# Patient Record
Sex: Female | Born: 1985 | Race: Black or African American | Hispanic: No | Marital: Single | Smoking: Current every day smoker
Health system: Southern US, Community
[De-identification: ages and names within clinical notes are randomized; demographics above are authoritative.]

## PROBLEM LIST (undated history)

## (undated) ENCOUNTER — Inpatient Hospital Stay (HOSPITAL_COMMUNITY): Payer: Self-pay

## (undated) DIAGNOSIS — F329 Major depressive disorder, single episode, unspecified: Secondary | ICD-10-CM

## (undated) DIAGNOSIS — A599 Trichomoniasis, unspecified: Secondary | ICD-10-CM

## (undated) DIAGNOSIS — F319 Bipolar disorder, unspecified: Secondary | ICD-10-CM

## (undated) DIAGNOSIS — Z87440 Personal history of urinary (tract) infections: Secondary | ICD-10-CM

## (undated) DIAGNOSIS — R87619 Unspecified abnormal cytological findings in specimens from cervix uteri: Secondary | ICD-10-CM

## (undated) DIAGNOSIS — Z8619 Personal history of other infectious and parasitic diseases: Secondary | ICD-10-CM

## (undated) DIAGNOSIS — F32A Depression, unspecified: Secondary | ICD-10-CM

## (undated) DIAGNOSIS — IMO0002 Reserved for concepts with insufficient information to code with codable children: Secondary | ICD-10-CM

## (undated) HISTORY — DX: Personal history of other infectious and parasitic diseases: Z86.19

## (undated) HISTORY — PX: ADENOIDECTOMY: SUR15

## (undated) HISTORY — DX: Personal history of urinary (tract) infections: Z87.440

---
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
## 2003-03-14 ENCOUNTER — Emergency Department (HOSPITAL_COMMUNITY): Admission: EM | Admit: 2003-03-14 | Discharge: 2003-03-14 | Payer: Self-pay | Admitting: *Deleted

## 2003-09-05 ENCOUNTER — Emergency Department (HOSPITAL_COMMUNITY): Admission: EM | Admit: 2003-09-05 | Discharge: 2003-09-05 | Payer: Self-pay | Admitting: Emergency Medicine

## 2004-04-08 ENCOUNTER — Inpatient Hospital Stay (HOSPITAL_COMMUNITY): Admission: AD | Admit: 2004-04-08 | Discharge: 2004-04-08 | Payer: Self-pay | Admitting: Obstetrics and Gynecology

## 2004-04-13 ENCOUNTER — Inpatient Hospital Stay (HOSPITAL_COMMUNITY): Admission: AD | Admit: 2004-04-13 | Discharge: 2004-04-13 | Payer: Self-pay | Admitting: Obstetrics & Gynecology

## 2004-04-18 ENCOUNTER — Ambulatory Visit (HOSPITAL_COMMUNITY): Admission: RE | Admit: 2004-04-18 | Discharge: 2004-04-18 | Payer: Self-pay | Admitting: *Deleted

## 2004-08-11 ENCOUNTER — Inpatient Hospital Stay (HOSPITAL_COMMUNITY): Admission: AD | Admit: 2004-08-11 | Discharge: 2004-08-11 | Payer: Self-pay | Admitting: Family Medicine

## 2004-08-20 ENCOUNTER — Inpatient Hospital Stay (HOSPITAL_COMMUNITY): Admission: AD | Admit: 2004-08-20 | Discharge: 2004-08-23 | Payer: Self-pay | Admitting: *Deleted

## 2004-08-20 ENCOUNTER — Ambulatory Visit: Payer: Self-pay | Admitting: *Deleted

## 2004-08-28 ENCOUNTER — Ambulatory Visit: Payer: Self-pay | Admitting: Family Medicine

## 2004-09-03 ENCOUNTER — Ambulatory Visit (HOSPITAL_COMMUNITY): Admission: RE | Admit: 2004-09-03 | Discharge: 2004-09-03 | Payer: Self-pay | Admitting: *Deleted

## 2004-09-11 ENCOUNTER — Ambulatory Visit: Payer: Self-pay | Admitting: Obstetrics & Gynecology

## 2004-09-20 ENCOUNTER — Ambulatory Visit (HOSPITAL_COMMUNITY): Admission: RE | Admit: 2004-09-20 | Discharge: 2004-09-20 | Payer: Self-pay | Admitting: *Deleted

## 2004-09-23 ENCOUNTER — Ambulatory Visit: Payer: Self-pay | Admitting: Obstetrics and Gynecology

## 2004-09-23 ENCOUNTER — Ambulatory Visit (HOSPITAL_COMMUNITY): Admission: RE | Admit: 2004-09-23 | Discharge: 2004-09-23 | Payer: Self-pay | Admitting: *Deleted

## 2004-09-25 ENCOUNTER — Ambulatory Visit: Payer: Self-pay | Admitting: *Deleted

## 2004-10-01 ENCOUNTER — Ambulatory Visit: Payer: Self-pay | Admitting: *Deleted

## 2004-10-01 ENCOUNTER — Inpatient Hospital Stay (HOSPITAL_COMMUNITY): Admission: RE | Admit: 2004-10-01 | Discharge: 2004-10-01 | Payer: Self-pay | Admitting: *Deleted

## 2004-10-14 ENCOUNTER — Inpatient Hospital Stay (HOSPITAL_COMMUNITY): Admission: AD | Admit: 2004-10-14 | Discharge: 2004-10-14 | Payer: Self-pay | Admitting: Obstetrics and Gynecology

## 2004-10-16 ENCOUNTER — Ambulatory Visit: Payer: Self-pay | Admitting: *Deleted

## 2004-10-23 ENCOUNTER — Ambulatory Visit: Payer: Self-pay | Admitting: Obstetrics & Gynecology

## 2004-10-30 ENCOUNTER — Ambulatory Visit (HOSPITAL_COMMUNITY): Admission: RE | Admit: 2004-10-30 | Discharge: 2004-10-30 | Payer: Self-pay | Admitting: Obstetrics and Gynecology

## 2004-10-30 ENCOUNTER — Ambulatory Visit: Payer: Self-pay | Admitting: *Deleted

## 2004-11-06 ENCOUNTER — Inpatient Hospital Stay (HOSPITAL_COMMUNITY): Admission: AD | Admit: 2004-11-06 | Discharge: 2004-11-06 | Payer: Self-pay | Admitting: Obstetrics and Gynecology

## 2004-11-14 ENCOUNTER — Ambulatory Visit: Payer: Self-pay | Admitting: Family Medicine

## 2004-11-25 ENCOUNTER — Ambulatory Visit (HOSPITAL_COMMUNITY): Admission: AD | Admit: 2004-11-25 | Discharge: 2004-11-25 | Payer: Self-pay | Admitting: *Deleted

## 2004-11-28 ENCOUNTER — Ambulatory Visit: Payer: Self-pay | Admitting: Family Medicine

## 2004-12-02 ENCOUNTER — Ambulatory Visit: Payer: Self-pay | Admitting: Obstetrics & Gynecology

## 2004-12-02 ENCOUNTER — Inpatient Hospital Stay (HOSPITAL_COMMUNITY): Admission: AD | Admit: 2004-12-02 | Discharge: 2004-12-05 | Payer: Self-pay | Admitting: Obstetrics and Gynecology

## 2005-07-13 IMAGING — US US OB LIMITED
1 series · 13 of 28 positions shown · non-contrast
Comparison: none

CLINICAL DATA: 31 week 2 day assigned gestational age.  Multiple fetal anomalies and decreased amniotic fluid volume.  Follow-up amniotic fluid volume, biophysical profile and fetal Dopplers.

[Series 1: us ob limited · 0.33mm/px · 13 of 36 slices shown]
[im 2/36]
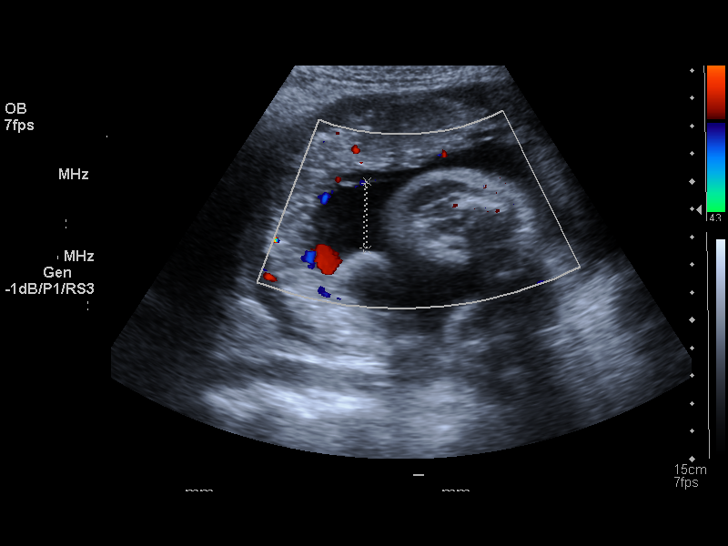
[im 4/36]
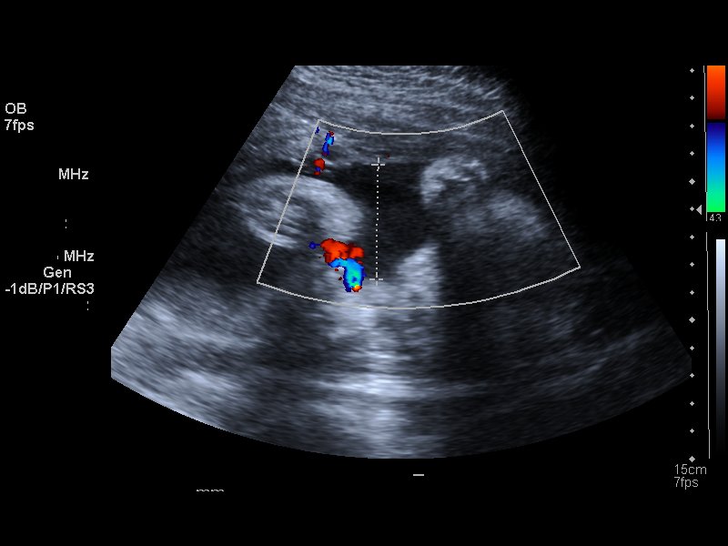
[im 7/36]
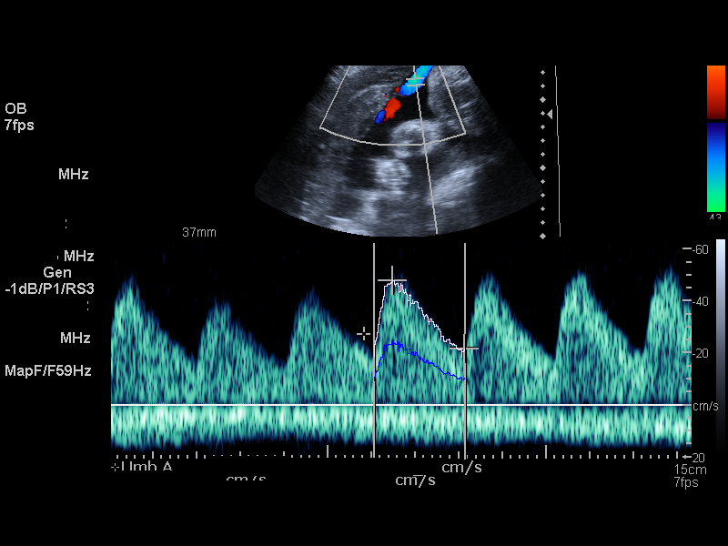
[im 10/36]
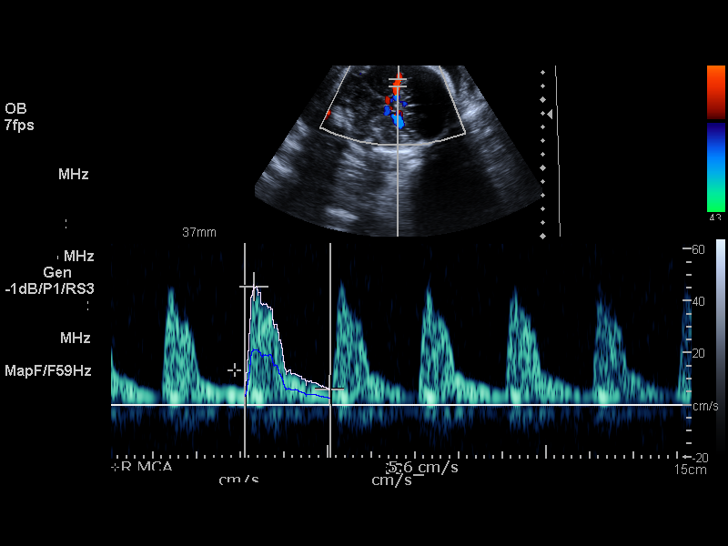
[im 12/36]
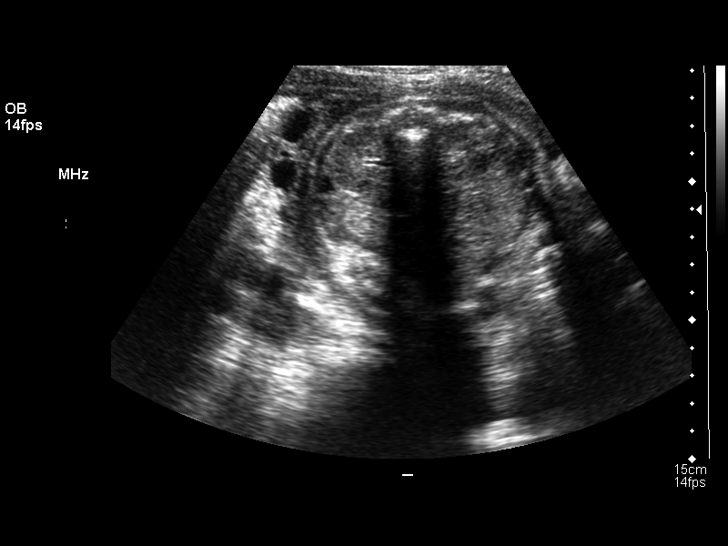
[im 15/36]
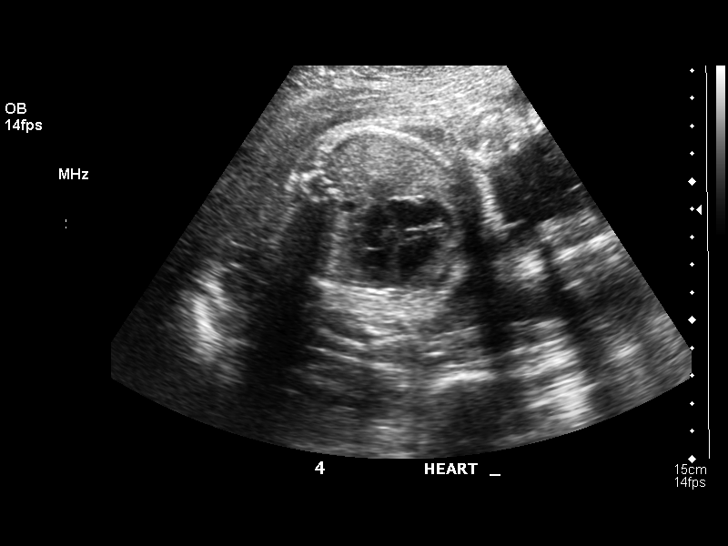
[im 19/36]
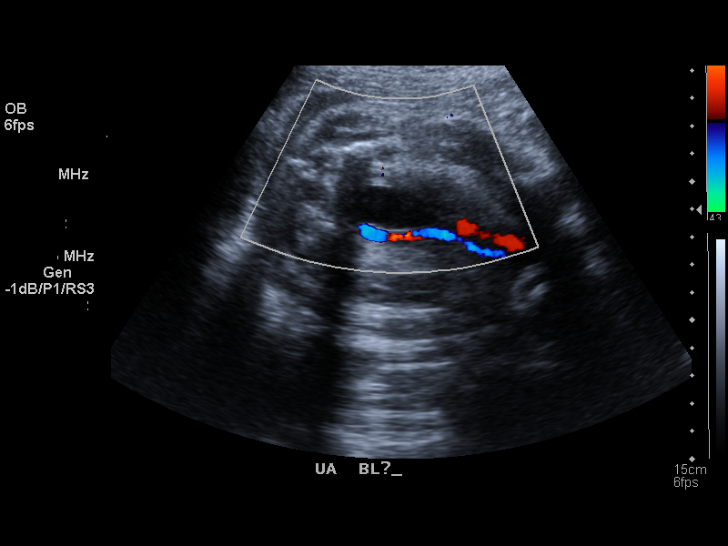
[im 21/36]
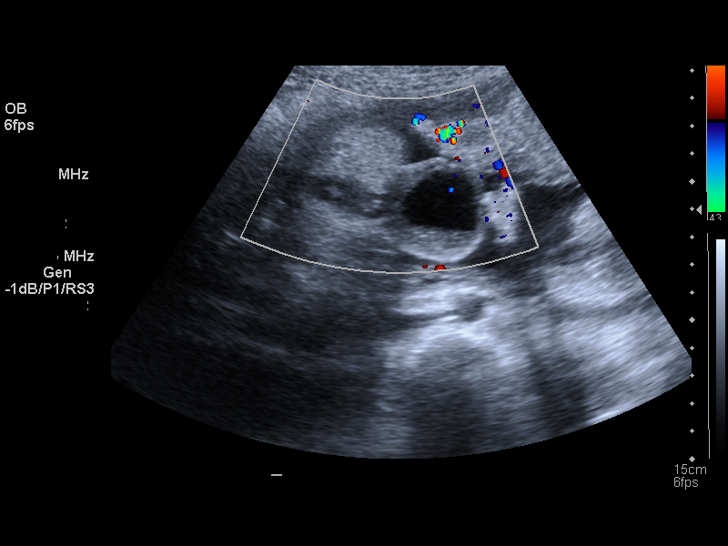
[im 24/36]
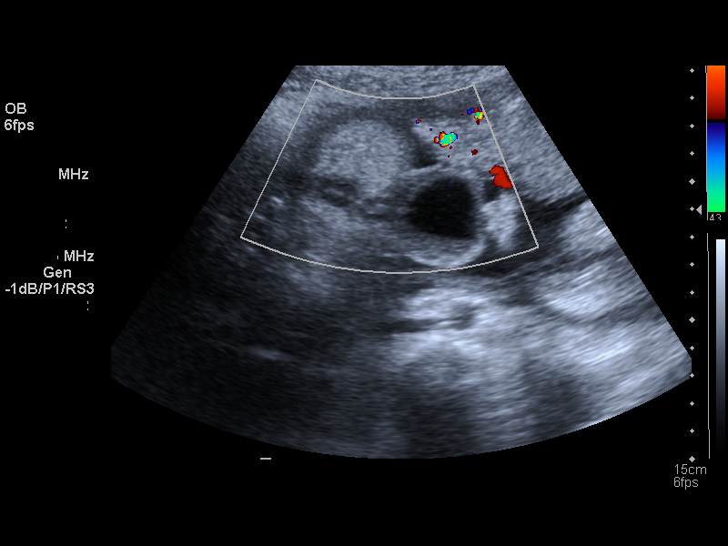
[im 26/36]
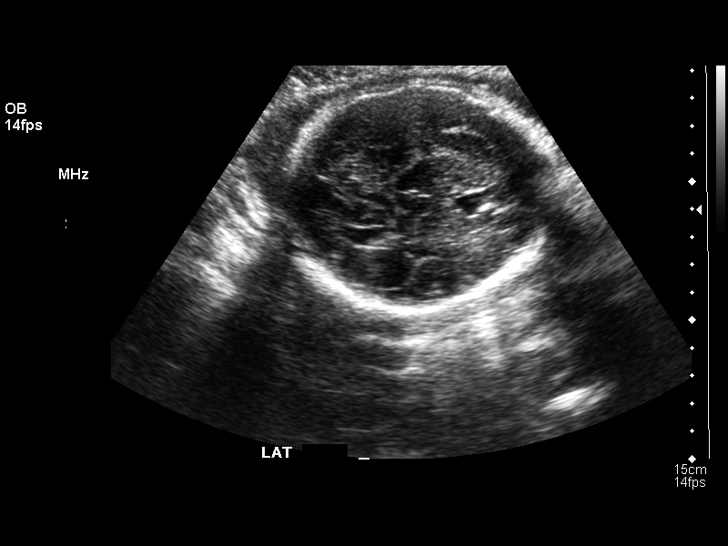
[im 29/36]
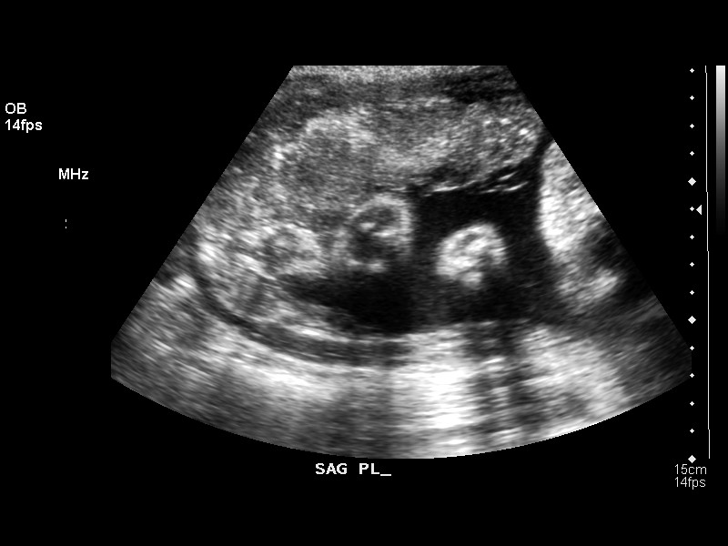
[im 32/36]
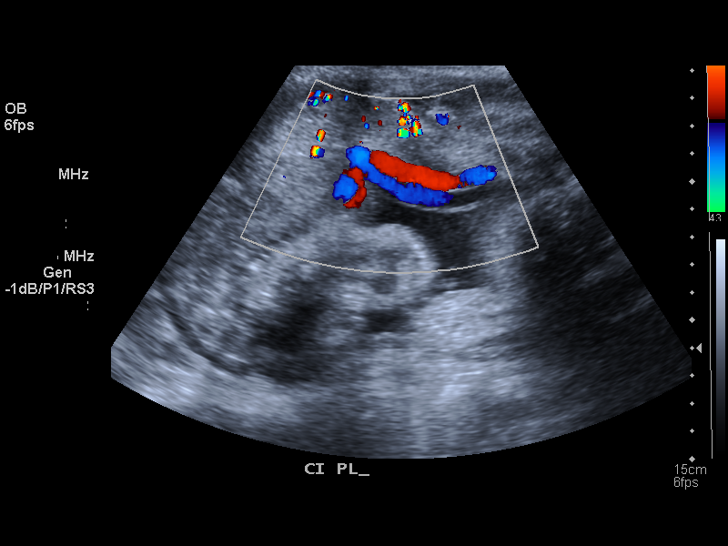
[im 34/36]
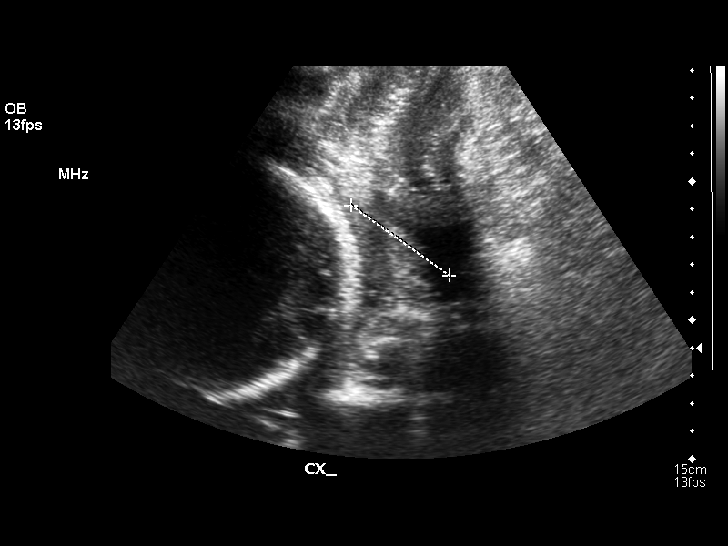

[13 of 28 positions shown; findings below may reference images not displayed]

LIMITED OBSTETRICAL ULTRASOUND:
 Number of Fetuses:  1
 Heart Rate:  150
 Movement:  Yes
 Breathing:  Yes
 Presentation:  Cephalic
 Placental Location:  Anterior
 Grade:  II
 Previa:  No
 Amniotic Fluid (Subjective):  Borderline decreased
 Amniotic Fluid (Objective):  9.2 cm AFI (5th -95th%ile = 8.8 ? 23.8 cm for 31 wks)

 Fetal measurements and complete anatomic evaluation were not requested.  The following fetal anatomy was visualized during this exam:  Lateral ventricles, four chamber heart on the right, stomach on the right, 2-vessel cord again noted and kidneys.
 Comment:  Again noted is situs inversus totalis, as well as 2-vessel cord and cystic structure arising from the genitalia measuring approximately 3.8 cm.  No evidence of fetal hydrops.  

 MATERNAL FINDINGS
 Cervix:  3.8 cm Translabially
IMPRESSION: Single living intrauterine fetus in cephalic presentation.  Amniotic fluid volume remains mildly decreased with AFI of 9.2 cm.  This is not significantly changed since prior study.  
 Multiple fetal anomalies again noted, as discussed above.

 BIOPHYSICAL PROFILE

 Movement:  2    Time:  25 minutes
 Breathing:  2
 Tone:  2
 Amniotic Fluid:  2

 Total Score:  8
IMPRESSION: Biophysical profile score [DATE].
 DOPPLER ULTRASOUND OF FETUS:

 Umbilical Artery S/D Ratio:  2.50    (NL< 400)

 Middle Cerebral Artery PI: 1.93    (NL> 1.48)
IMPRESSION: Both umbilical artery and fetal middle cerebral artery Doppler indices are within normal limits.

## 2005-09-19 ENCOUNTER — Emergency Department (HOSPITAL_COMMUNITY): Admission: EM | Admit: 2005-09-19 | Discharge: 2005-09-19 | Payer: Self-pay | Admitting: Family Medicine

## 2005-09-26 ENCOUNTER — Emergency Department (HOSPITAL_COMMUNITY): Admission: EM | Admit: 2005-09-26 | Discharge: 2005-09-26 | Payer: Self-pay | Admitting: Emergency Medicine

## 2005-12-30 ENCOUNTER — Inpatient Hospital Stay (HOSPITAL_COMMUNITY): Admission: AD | Admit: 2005-12-30 | Discharge: 2005-12-30 | Payer: Self-pay | Admitting: *Deleted

## 2006-01-29 ENCOUNTER — Inpatient Hospital Stay (HOSPITAL_COMMUNITY): Admission: AD | Admit: 2006-01-29 | Discharge: 2006-01-30 | Payer: Self-pay | Admitting: Family Medicine

## 2006-01-29 ENCOUNTER — Ambulatory Visit: Payer: Self-pay | Admitting: Family Medicine

## 2006-03-25 ENCOUNTER — Inpatient Hospital Stay (HOSPITAL_COMMUNITY): Admission: AD | Admit: 2006-03-25 | Discharge: 2006-03-27 | Payer: Self-pay | Admitting: Obstetrics and Gynecology

## 2006-05-20 ENCOUNTER — Inpatient Hospital Stay (HOSPITAL_COMMUNITY): Admission: AD | Admit: 2006-05-20 | Discharge: 2006-05-20 | Payer: Self-pay | Admitting: Obstetrics & Gynecology

## 2006-05-21 ENCOUNTER — Inpatient Hospital Stay (HOSPITAL_COMMUNITY): Admission: AD | Admit: 2006-05-21 | Discharge: 2006-05-24 | Payer: Self-pay | Admitting: Obstetrics

## 2010-11-30 ENCOUNTER — Encounter: Payer: Self-pay | Admitting: *Deleted

## 2011-07-08 ENCOUNTER — Inpatient Hospital Stay (HOSPITAL_COMMUNITY)
Admission: AD | Admit: 2011-07-08 | Discharge: 2011-07-08 | Disposition: A | Discharge status: Home / Self Care | Payer: Medicaid Other | Source: Ambulatory Visit | Attending: Family Medicine | Admitting: Family Medicine

## 2011-07-08 DIAGNOSIS — Z3201 Encounter for pregnancy test, result positive: Secondary | ICD-10-CM | POA: Insufficient documentation

## 2011-07-08 LAB — POCT PREGNANCY, URINE: Preg Test, Ur: POSITIVE

## 2011-07-08 NOTE — ED Provider Notes (Signed)
History     Chief Complaint  Patient presents with  . Amenorrhea   HPI Kim Briggs 25 y.o. needs only verification of positive home pregnancy test.  No pain.  No bleeding.    OB History    No data available      No past medical history on file.  No past surgical history on file.  No family history on file.  History  Substance Use Topics  . Smoking status: Not on file  . Smokeless tobacco: Not on file  . Alcohol Use: Not on file    Allergies: No Known Allergies  No prescriptions prior to admission    Review of Systems  Gastrointestinal: Negative for abdominal pain.  Genitourinary:       No vaginal bleeding.   Physical Exam   Blood pressure 115/63, pulse 90, temperature 99 F (37.2 C), temperature source Oral, resp. rate 20, height 5' 4.25" (1.632 m), weight 198 lb (89.812 kg), last menstrual period 05/22/2011.  Physical Exam  Nursing note and vitals reviewed. Constitutional: She is oriented to person, place, and time. She appears well-developed and well-nourished.  HENT:  Head: Normocephalic.  Eyes: EOM are normal.  Neck: Neck supple.  Musculoskeletal: Normal range of motion.  Neurological: She is alert and oriented to person, place, and time.  Skin: Skin is warm and dry.  Psychiatric: She has a normal mood and affect.    MAU Course  Procedures  MDM Results for orders placed during the hospital encounter of 07/08/11 (from the past 24 hour(s))  POCT PREGNANCY, URINE     Status: Normal   Collection Time   07/08/11 11:27 PM      Component Value Range   Preg Test, Ur POSITIVE       Assessment and Plan  Positive pregnancy test  Plan: Your pregnancy test is positive.  No smoking, no drugs, no alcohol.  Take a prenatal vitamin one by mouth every day.  Eat small frequent snacks to avoid nausea.  Begin prenatal care as soon as possible.   BURLESON,TERRI 07/08/2011, 11:40 PM

## 2011-07-08 NOTE — Progress Notes (Signed)
Pt states, " I have missed my period, and had two positive HPT. I need a pregnancy letter so I can start care."

## 2011-07-10 NOTE — ED Provider Notes (Signed)
Chart reviewed and agree with management and plan.  

## 2011-07-20 ENCOUNTER — Inpatient Hospital Stay (HOSPITAL_COMMUNITY)
Admission: AD | Admit: 2011-07-20 | Discharge: 2011-07-20 | Disposition: A | Discharge status: Home / Self Care | Payer: Medicaid Other | Source: Ambulatory Visit | Attending: Obstetrics & Gynecology | Admitting: Obstetrics & Gynecology

## 2011-07-20 ENCOUNTER — Encounter (HOSPITAL_COMMUNITY): Payer: Self-pay | Admitting: *Deleted

## 2011-07-20 DIAGNOSIS — O210 Mild hyperemesis gravidarum: Secondary | ICD-10-CM | POA: Insufficient documentation

## 2011-07-20 LAB — URINALYSIS, ROUTINE W REFLEX MICROSCOPIC
Bilirubin Urine: NEGATIVE
Glucose, UA: NEGATIVE mg/dL
Hgb urine dipstick: NEGATIVE
Ketones, ur: 15 mg/dL — AB
Leukocytes, UA: NEGATIVE
Nitrite: NEGATIVE
Protein, ur: NEGATIVE mg/dL
Specific Gravity, Urine: 1.025 (ref 1.005–1.030)
Urobilinogen, UA: 1 mg/dL (ref 0.0–1.0)
pH: 7 (ref 5.0–8.0)

## 2011-07-20 MED ORDER — SODIUM CHLORIDE 0.9 % IV SOLN
8.0000 mg | Freq: Once | INTRAVENOUS | Status: AC
  Administered 2011-07-20: 8 mg via INTRAVENOUS
  Filled 2011-07-20: qty 4

## 2011-07-20 MED ORDER — M.V.I. ADULT IV INJ
10.0000 mL | Freq: Once | INTRAVENOUS | Status: AC
  Administered 2011-07-20: 10 mL via INTRAVENOUS
  Filled 2011-07-20: qty 10

## 2011-07-20 MED ORDER — PROMETHAZINE HCL 50 MG PO TABS: 25.0000 mg | ORAL_TABLET | Freq: Four times a day (QID) | ORAL | Status: AC | PRN

## 2011-07-20 MED ORDER — DEXTROSE IN LACTATED RINGERS 5 % IV SOLN
Freq: Once | INTRAVENOUS | Status: AC
  Administered 2011-07-20: 03:00:00 via INTRAVENOUS

## 2011-07-20 MED ORDER — FAMOTIDINE IN NACL 20-0.9 MG/50ML-% IV SOLN
20.0000 mg | Freq: Two times a day (BID) | INTRAVENOUS | Status: DC
  Administered 2011-07-20: 20 mg via INTRAVENOUS
  Filled 2011-07-20: qty 50

## 2011-07-20 NOTE — Progress Notes (Signed)
Written and verbal d/c instructions given and understanding voiced. 

## 2011-07-20 NOTE — Progress Notes (Signed)
[facility] CNM in to see pt. Unable to hear FHR by doppler. CNM brought in u/s and able to see cardiac activity by u/s. Pt reassured

## 2011-07-20 NOTE — ED Notes (Signed)
Pt up to BR

## 2011-07-20 NOTE — ED Provider Notes (Signed)
Chief Complaint:  Abdominal Pain and Emesis During Pregnancy   Kim Briggs is  25 y.o. [id number].  Patient's last menstrual period was 05/22/2011.Marland Kitchen  Her pregnancy status is positive.  She presents complaining of Abdominal Pain and Emesis During Pregnancy . Onset is described as insidious and has been present for  2 days. Nausea with occassional vomiting during pregnancy until yesterday when vomiting increased and unable to keep any liquids or solids down. Reports 4-5 episodes of vomiting since last night. Denies fever, chills, diarrhea, vag bleeding or discharge. States sharp intermittent abd pain.  Obstetrical/Gynecological History: OB History    Grav Para Term Preterm Abortions TAB SAB Ect Mult Living   3 2 2  0 0 0 0 0 0 2      Past Medical History: Past Medical History  Diagnosis Date  . Asthma     Past Surgical History: Past Surgical History  Procedure Date  . No past surgeries     Family History: No family history on file.  Social History: History  Substance Use Topics  . Smoking status: Current Everyday Smoker  . Smokeless tobacco: Not on file  . Alcohol Use: No    Allergies: No Known Allergies  No prescriptions prior to admission    Review of Systems - History obtained from the patient General ROS: negative Allergy and Immunology ROS: negative Endocrine ROS: positive for - breast changes Respiratory ROS: no cough, shortness of breath, or wheezing Cardiovascular ROS: no chest pain or dyspnea on exertion Gastrointestinal ROS: positive for - abdominal pain and nausea/vomiting Genito-Urinary ROS: no dysuria, trouble voiding, or hematuria, no vag bleeding or discharge  Physical Exam   Blood pressure 114/62, pulse 85, temperature 98.9 F (37.2 C), resp. rate 20, height 5\' 4"  (1.626 m), weight 87.635 kg (193 lb 3.2 oz), last menstrual period 05/22/2011.  General: General appearance - alert, well appearing, and in no distress and overweight Mental status -  alert, oriented to person, place, and time, normal mood, behavior, speech, dress, motor activity, and thought processes, affect appropriate to mood Mouth - dental hygiene poor and mucous membranes dry Abdomen - soft, nontender, nondistended, no masses or organomegaly Back exam - full range of motion, no tenderness, palpable spasm or pain on motion Focused Gynecological Exam: examination not indicated, IUP with cardiac activity  Labs: No results found for this or any previous visit (from the past 24 hour(s)). Imaging Studies:  No results found.   Assessment Hyperemesis  Plan: Discharge home Rx antiemetics  SHORES,SUZANNE E. 07/20/2011,2:31 AM   Attestation of Attending Supervision of Advanced Practitioner (CNM/NP): Evaluation and management procedures were performed by the Advanced Practitioner under my supervision and collaboration. I have reviewed the Advanced Practitioner's note and chart, and I agree with the management and plan.  Merrin Mcvicker H. 11:02 AM

## 2011-07-20 NOTE — ED Notes (Signed)
Pt has had couple episodes of heaves with small amt cl emesis.

## 2011-07-20 NOTE — Progress Notes (Signed)
Discussed with pt eating small frequent meals and eating bland foods until nausea improves

## 2011-07-20 NOTE — Progress Notes (Signed)
GeP2 at 12wks. Has had n/v the entire pregnancy but worse since yesterday. Unable to keep down anything. Sharp pain lower abd and on sides of abd. Denies diarrhea

## 2011-09-01 LAB — ANTIBODY SCREEN: Antibody Screen: NEGATIVE

## 2011-09-01 LAB — HEPATITIS B SURFACE ANTIGEN: Hepatitis B Surface Ag: NEGATIVE

## 2011-09-01 LAB — ABO/RH

## 2011-09-01 LAB — RPR: RPR: NONREACTIVE

## 2011-11-11 NOTE — L&D Delivery Note (Signed)
Delivery Note Called by pt's RN around 0110 with report of pt feeling pressure.  FHT stable in 130's, with some early decels; shortly after IUPC had been placed, FHT with a run of moderate variables, but these resolved w/ position change.  Pt complete and +2 with urge to push at 0118.  Pushed great to SVD at 1:32 AM.  A viable female "[name]" was delivered via Vaginal, Spontaneous Delivery (Presentation: Right Occiput Anterior).  APGAR: 8, 9; weight 7 lb 13 oz (3544 g).  Newborn placed immediately on mom's abdomen where dried and stimulated and skin-to-skin initiated.  Cord doubly clamped and cut by FOB.   Placenta status: Intact, Spontaneous, Schultz.  Cord: 3 vessels with the following complications: None.  Cord pH: n/a  Anesthesia: Epidural  Episiotomy: None Lacerations: Rt Periurethral (hemostatic; not repaired). Suture Repair: n/a Est. Blood Loss (mL): 200  Mom to postpartum.  Baby to nursery-stable. Pt intends to breastfeed.    [name] H 03/03/2012, 6:56 AM

## 2012-01-07 ENCOUNTER — Encounter: Payer: Medicaid Other | Admitting: Obstetrics and Gynecology

## 2012-01-14 ENCOUNTER — Encounter (INDEPENDENT_AMBULATORY_CARE_PROVIDER_SITE_OTHER): Payer: Medicaid Other | Admitting: Obstetrics and Gynecology

## 2012-01-14 DIAGNOSIS — Z331 Pregnant state, incidental: Secondary | ICD-10-CM

## 2012-01-27 ENCOUNTER — Encounter: Payer: Medicaid Other | Admitting: Obstetrics and Gynecology

## 2012-02-10 ENCOUNTER — Encounter: Payer: Medicaid Other | Admitting: Obstetrics and Gynecology

## 2012-02-12 ENCOUNTER — Encounter: Payer: Medicaid Other | Admitting: Obstetrics and Gynecology

## 2012-02-16 ENCOUNTER — Encounter (INDEPENDENT_AMBULATORY_CARE_PROVIDER_SITE_OTHER): Payer: Medicaid Other | Admitting: Registered Nurse

## 2012-02-16 ENCOUNTER — Other Ambulatory Visit: Payer: Self-pay | Admitting: Registered Nurse

## 2012-02-16 DIAGNOSIS — Z0000 Encounter for general adult medical examination without abnormal findings: Secondary | ICD-10-CM

## 2012-02-16 DIAGNOSIS — Z331 Pregnant state, incidental: Secondary | ICD-10-CM

## 2012-02-23 DIAGNOSIS — R87612 Low grade squamous intraepithelial lesion on cytologic smear of cervix (LGSIL): Secondary | ICD-10-CM

## 2012-02-23 DIAGNOSIS — F319 Bipolar disorder, unspecified: Secondary | ICD-10-CM | POA: Insufficient documentation

## 2012-02-23 DIAGNOSIS — F1990 Other psychoactive substance use, unspecified, uncomplicated: Secondary | ICD-10-CM

## 2012-02-23 DIAGNOSIS — IMO0002 Reserved for concepts with insufficient information to code with codable children: Secondary | ICD-10-CM | POA: Insufficient documentation

## 2012-02-23 DIAGNOSIS — F17200 Nicotine dependence, unspecified, uncomplicated: Secondary | ICD-10-CM | POA: Insufficient documentation

## 2012-02-24 ENCOUNTER — Encounter (HOSPITAL_COMMUNITY): Payer: Self-pay | Admitting: *Deleted

## 2012-02-24 ENCOUNTER — Inpatient Hospital Stay (HOSPITAL_COMMUNITY)
Admission: AD | Admit: 2012-02-24 | Discharge: 2012-02-24 | Disposition: A | Discharge status: Home / Self Care | Payer: Medicaid Other | Source: Ambulatory Visit | Attending: Obstetrics and Gynecology | Admitting: Obstetrics and Gynecology

## 2012-02-24 DIAGNOSIS — F1990 Other psychoactive substance use, unspecified, uncomplicated: Secondary | ICD-10-CM

## 2012-02-24 DIAGNOSIS — O471 False labor at or after 37 completed weeks of gestation: Secondary | ICD-10-CM

## 2012-02-24 DIAGNOSIS — O99340 Other mental disorders complicating pregnancy, unspecified trimester: Secondary | ICD-10-CM

## 2012-02-24 DIAGNOSIS — R87612 Low grade squamous intraepithelial lesion on cytologic smear of cervix (LGSIL): Secondary | ICD-10-CM

## 2012-02-24 DIAGNOSIS — O99330 Smoking (tobacco) complicating pregnancy, unspecified trimester: Secondary | ICD-10-CM | POA: Insufficient documentation

## 2012-02-24 DIAGNOSIS — F319 Bipolar disorder, unspecified: Secondary | ICD-10-CM

## 2012-02-24 DIAGNOSIS — F17200 Nicotine dependence, unspecified, uncomplicated: Secondary | ICD-10-CM

## 2012-02-24 DIAGNOSIS — IMO0002 Reserved for concepts with insufficient information to code with codable children: Secondary | ICD-10-CM

## 2012-02-24 DIAGNOSIS — O479 False labor, unspecified: Secondary | ICD-10-CM | POA: Insufficient documentation

## 2012-02-24 HISTORY — DX: Bipolar disorder, unspecified: F31.9

## 2012-02-24 HISTORY — DX: Depression, unspecified: F32.A

## 2012-02-24 HISTORY — DX: Major depressive disorder, single episode, unspecified: F32.9

## 2012-02-24 MED ORDER — DIPHENHYDRAMINE HCL 25 MG PO CAPS
50.0000 mg | ORAL_CAPSULE | Freq: Once | ORAL | Status: AC
  Administered 2012-02-24: 50 mg via ORAL
  Filled 2012-02-24: qty 2

## 2012-02-24 NOTE — Discharge Instructions (Signed)
Normal Labor and Delivery Your caregiver must first be sure you are in labor. Signs of labor include:  You may pass what is called "the mucus plug" before labor begins. This is a small amount of blood stained mucus.   Regular uterine contractions.   The time between contractions get closer together.   The discomfort and pain gradually gets more intense.   Pains are mostly located in the back.   Pains get worse when walking.   The cervix (the opening of the uterus becomes thinner (begins to efface) and opens up (dilates).  Once you are in labor and admitted into the hospital or care center, your caregiver will do the following:  A complete physical examination.   Check your vital signs (blood pressure, pulse, temperature and the fetal heart rate).   Do a vaginal examination (using a sterile glove and lubricant) to determine:   The position (presentation) of the baby (head [vertex] or buttock first).   The level (station) of the baby's head in the birth canal.   The effacement and dilatation of the cervix.   You may have your pubic hair shaved and be given an enema depending on your caregiver and the circumstance.   An electronic monitor is usually placed on your abdomen. The monitor follows the length and intensity of the contractions, as well as the baby's heart rate.   Usually, your caregiver will insert an IV in your arm with a bottle of sugar water. This is done as a precaution so that medications can be given to you quickly during labor or delivery.  NORMAL LABOR AND DELIVERY IS DIVIDED UP INTO 3 STAGES: First Stage This is when regular contractions begin and the cervix begins to efface and dilate. This stage can last from 3 to 15 hours. The end of the first stage is when the cervix is 100% effaced and 10 centimeters dilated. Pain medications may be given by   Injection (morphine, demerol, etc.)   Regional anesthesia (spinal, caudal or epidural, anesthetics given in  different locations of the spine). Paracervical pain medication may be given, which is an injection of and anesthetic on each side of the cervix.  A pregnant woman may request to have "Natural Childbirth" which is not to have any medications or anesthesia during her labor and delivery. Second Stage This is when the baby comes down through the birth canal (vagina) and is born. This can take 1 to 4 hours. As the baby's head comes down through the birth canal, you may feel like you are going to have a bowel movement. You will get the urge to bear down and push until the baby is delivered. As the baby's head is being delivered, the caregiver will decide if an episiotomy (a cut in the perineum and vagina area) is needed to prevent tearing of the tissue in this area. The episiotomy is sewn up after the delivery of the baby and placenta. Sometimes a mask with nitrous oxide is given for the mother to breath during the delivery of the baby to help if there is too much pain. The end of Stage 2 is when the baby is fully delivered. Then when the umbilical cord stops pulsating it is clamped and cut. Third Stage The third stage begins after the baby is completely delivered and ends after the placenta (afterbirth) is delivered. This usually takes 5 to 30 minutes. After the placenta is delivered, a medication is given either by intravenous or injection to help contract   the uterus and prevent bleeding. The third stage is not painful and pain medication is usually not necessary. If an episiotomy was done, it is repaired at this time. After the delivery, the mother is watched and monitored closely for 1 to 2 hours to make sure there is no postpartum bleeding (hemorrhage). If there is a lot of bleeding, medication is given to contract the uterus and stop the bleeding. Document Released: 08/05/2008 Document Revised: 10/16/2011 Document Reviewed: 08/05/2008 [employer] Patient Information 2012 [employer]. 

## 2012-02-24 NOTE — MAU Provider Note (Signed)
  History   Kim Briggs is a 25y.o. Black female who presents unannounced for labor check at 39.5 weeks w/ CC of ctxs since about 1730.  Reports pain w/ ctxs is primarily on her "Rt side" and feels like a "pinching" feeling.  Denies LOF or VB.  Reports cx in office last week ="Fingertip."  Denies any recent illness, fever, UTI, or PIH s/s.  Report "increased" fetal movement today.    Pregnancy r/f: 1.  abnl pap and colpo during pregnancy 2.  H/o incarceration about 2-3 years ago for drug charges 3. Smoker 4.  H/o polyhydramnios and oligohydramnios in last pregnancy 5.  Bipolar--no meds 6. H/o chlamydia in the past  CSN: [id number]  Arrival date and time: 02/24/12 2227   None     Chief Complaint  Patient presents with  . Abdominal Pain   HPI  OB History    Grav Para Term Preterm Abortions TAB SAB Ect Mult Living   2 1 1  0 0 0 0 0 0 1      Past Medical History  Diagnosis Date  . Asthma   . Depression   . Bipolar 1 disorder     Past Surgical History  Procedure Date  . No past surgeries     Family History  Problem Relation Age of Onset  . Hypertension Mother   . Diabetes Mother   . Heart disease Mother   . Hypertension Father     History  Substance Use Topics  . Smoking status: Current Everyday Smoker -- 0.5 packs/day  . Smokeless tobacco: Not on file  . Alcohol Use: No    Allergies: No Known Allergies  Prescriptions prior to admission  Medication Sig Dispense Refill  . flintstones complete (FLINTSTONES) 60 MG chewable tablet Chew 1 tablet by mouth daily.        ROS--see history above Physical Exam  EFM:  140, reactive, moderate variability, no decels TOCO:  UC's q 8-9 min Blood pressure 132/70, pulse 99, temperature 98.1 F (36.7 C), temperature source Oral, resp. rate 18, height 5' 5.5" (1.664 m), weight 101.152 kg (223 lb), last menstrual period 05/22/2011.  Physical Exam  Constitutional: She is oriented to person, place, and time. She appears  well-developed and well-nourished.       Grimace, but NAD, no labored breathing  Cardiovascular: Normal rate.   Respiratory: Effort normal.  GI: Soft.       gravid  Genitourinary:       Cx:  FT int os/ ext os 1cm/25/-2; post; medium thickness  Musculoskeletal: She exhibits edema.       Pedal and ankle edema and bilateral shins; 1+  Neurological: She is alert and oriented to person, place, and time.  Skin: Skin is warm and dry.    MAU Course  Procedures 1.  NST 2.  Benadryl 50mg  po x1 for therapeutic rest--given at time of d/c Assessment and Plan  1.  IUP at 39.5 2.  False vs early/prodromal labor 3.  Reactive NST 4.  Bipolar 5.  Smoker  1.  D/c home w/ labor precautions 2.  Keep appt tomorrow at [facility] 3.  F/u as needed  Jorgeluis Gurganus H 02/24/2012, 11:41 PM

## 2012-02-25 ENCOUNTER — Ambulatory Visit (INDEPENDENT_AMBULATORY_CARE_PROVIDER_SITE_OTHER): Payer: Medicaid Other | Admitting: Obstetrics and Gynecology

## 2012-02-25 VITALS — BP 110/68 | Wt 225.0 lb

## 2012-02-25 DIAGNOSIS — Z331 Pregnant state, incidental: Secondary | ICD-10-CM

## 2012-02-25 DIAGNOSIS — R87612 Low grade squamous intraepithelial lesion on cytologic smear of cervix (LGSIL): Secondary | ICD-10-CM

## 2012-02-25 DIAGNOSIS — IMO0002 Reserved for concepts with insufficient information to code with codable children: Secondary | ICD-10-CM

## 2012-02-25 NOTE — Progress Notes (Signed)
Reports Ct every 8 minutes. Seen last night at MAU cervix was 1+ Benadryl helped sleep Reviewed post-dates  NST at next visit

## 2012-02-29 ENCOUNTER — Inpatient Hospital Stay (HOSPITAL_COMMUNITY)
Admission: AD | Admit: 2012-02-29 | Discharge: 2012-02-29 | Disposition: A | Discharge status: Home / Self Care | Payer: Medicaid Other | Source: Ambulatory Visit | Attending: Obstetrics and Gynecology | Admitting: Obstetrics and Gynecology

## 2012-02-29 ENCOUNTER — Encounter (HOSPITAL_COMMUNITY): Payer: Self-pay | Admitting: *Deleted

## 2012-02-29 DIAGNOSIS — O4700 False labor before 37 completed weeks of gestation, unspecified trimester: Secondary | ICD-10-CM

## 2012-02-29 DIAGNOSIS — O26859 Spotting complicating pregnancy, unspecified trimester: Secondary | ICD-10-CM

## 2012-02-29 DIAGNOSIS — O479 False labor, unspecified: Secondary | ICD-10-CM | POA: Insufficient documentation

## 2012-02-29 DIAGNOSIS — O471 False labor at or after 37 completed weeks of gestation: Secondary | ICD-10-CM

## 2012-02-29 NOTE — MAU Note (Signed)
Notified CNM patient in room 3 in MAU c/o bloody discharge G2P1 [redacted]w[redacted]d occasional contraction, fhr reactive.

## 2012-02-29 NOTE — MAU Provider Note (Signed)
Chief Complaint:  Vaginal Bleeding  First Provider Initiated Contact with Patient 02/29/12 1150    HPI  Kim Briggs is  26 y.o. G2P1001 at [redacted]w[redacted]d presents with scant brown discharge last night and this morning. She reports mild irregular contractions and denies leakage of fluid. Good fetal movement.   Pregnancy Course:  Patient Active Problem List  Diagnoses  . Smoker  . Drug use  . Abnormal Pap smear, low grade squamous intraepithelial lesion (LGSIL)  . Bipolar 1 disorder   Past Medical History: Past Medical History  Diagnosis Date  . Asthma   . Depression   . Bipolar 1 disorder     Past Surgical History: Past Surgical History  Procedure Date  . No past surgeries     Family History: Family History  Problem Relation Age of Onset  . Hypertension Mother   . Diabetes Mother   . Heart disease Mother   . Hypertension Father     Social History: History  Substance Use Topics  . Smoking status: Current Everyday Smoker -- 0.5 packs/day  . Smokeless tobacco: Not on file  . Alcohol Use: No    Allergies: No Known Allergies  Meds:  Prescriptions prior to admission  Medication Sig Dispense Refill  . flintstones complete (FLINTSTONES) 60 MG chewable tablet Chew 1 tablet by mouth daily.             Physical Exam  Blood pressure 133/70, pulse 91, temperature 98.7 F (37.1 C), temperature source Oral, resp. rate 20, height 5' 5.5" (1.664 m), weight 102.059 kg (225 lb), last menstrual period 05/22/2011. GENERAL: Well-developed, well-nourished female in no acute distress.  ABDOMEN: Soft, nontender, nondistended, gravid.  EXTREMITIES: Nontender, no edema Dilation: 1 Effacement (%):  (Long) Cervical Position: Posterior Station:  (HIGH) unable to reach Presentation: Vertex, verified by informal BS [place]. Exam by:: vsmith,cnm: Scant brown bloody show  FHT:  Baseline 140 , initially minimal variability, now moderate variability, accelerations present, no  decelerations Contractions: q 9-10 mins, mild   Labs:   Imaging:    Assessment: 1. False labor after 37 weeks of gestation without delivery   2.  Normal bloody show   Plan: Medication List  As of 02/29/2012 12:31 PM   CONTINUE taking these medications         flintstones complete 60 MG chewable tablet           Follow-up Information    Follow up with [facility] on 03/04/2012. (or MAU as needed if symptoms worsen)    Contact information:   [address] [place] [address]        Labor precautions and FKCs Comfort measures  [place], [place] 4/21/201312:21 PM

## 2012-02-29 NOTE — MAU Note (Signed)
Pt reports waking up and seeing some brownish bloody discharge. Denies ctx. Reports good fetal movement

## 2012-03-01 ENCOUNTER — Telehealth (HOSPITAL_COMMUNITY): Payer: Self-pay | Admitting: *Deleted

## 2012-03-01 ENCOUNTER — Encounter (HOSPITAL_COMMUNITY): Payer: Self-pay | Admitting: *Deleted

## 2012-03-01 NOTE — Telephone Encounter (Signed)
Preadmission screen  

## 2012-03-02 ENCOUNTER — Encounter (HOSPITAL_COMMUNITY): Payer: Self-pay | Admitting: *Deleted

## 2012-03-02 ENCOUNTER — Telehealth (HOSPITAL_COMMUNITY): Payer: Self-pay | Admitting: *Deleted

## 2012-03-02 ENCOUNTER — Inpatient Hospital Stay (HOSPITAL_COMMUNITY): Payer: Medicaid Other | Admitting: Anesthesiology

## 2012-03-02 ENCOUNTER — Inpatient Hospital Stay (HOSPITAL_COMMUNITY)
Admission: AD | Admit: 2012-03-02 | Discharge: 2012-03-04 | DRG: 775 | Disposition: A | Discharge status: Home / Self Care | Payer: Medicaid Other | Source: Ambulatory Visit | Attending: Obstetrics and Gynecology | Admitting: Obstetrics and Gynecology

## 2012-03-02 ENCOUNTER — Encounter (HOSPITAL_COMMUNITY): Payer: Self-pay | Admitting: Anesthesiology

## 2012-03-02 DIAGNOSIS — O480 Post-term pregnancy: Secondary | ICD-10-CM | POA: Diagnosis present

## 2012-03-02 DIAGNOSIS — F17200 Nicotine dependence, unspecified, uncomplicated: Secondary | ICD-10-CM

## 2012-03-02 DIAGNOSIS — O4290 Premature rupture of membranes, unspecified as to length of time between rupture and onset of labor, unspecified weeks of gestation: Principal | ICD-10-CM | POA: Diagnosis present

## 2012-03-02 DIAGNOSIS — IMO0002 Reserved for concepts with insufficient information to code with codable children: Secondary | ICD-10-CM

## 2012-03-02 DIAGNOSIS — O4210 Premature rupture of membranes, onset of labor more than 24 hours following rupture, unspecified weeks of gestation: Secondary | ICD-10-CM | POA: Diagnosis present

## 2012-03-02 DIAGNOSIS — F319 Bipolar disorder, unspecified: Secondary | ICD-10-CM

## 2012-03-02 DIAGNOSIS — O42919 Preterm premature rupture of membranes, unspecified as to length of time between rupture and onset of labor, unspecified trimester: Secondary | ICD-10-CM

## 2012-03-02 DIAGNOSIS — F1990 Other psychoactive substance use, unspecified, uncomplicated: Secondary | ICD-10-CM

## 2012-03-02 DIAGNOSIS — E669 Obesity, unspecified: Secondary | ICD-10-CM

## 2012-03-02 LAB — CBC
Hemoglobin: 11.2 g/dL — ABNORMAL LOW (ref 12.0–15.0)
MCH: 29.7 pg (ref 26.0–34.0)
RBC: 3.77 MIL/uL — ABNORMAL LOW (ref 3.87–5.11)
WBC: 13.2 10*3/uL — ABNORMAL HIGH (ref 4.0–10.5)

## 2012-03-02 LAB — RAPID URINE DRUG SCREEN, HOSP PERFORMED
Opiates: NOT DETECTED
Tetrahydrocannabinol: NOT DETECTED

## 2012-03-02 LAB — RPR: RPR Ser Ql: NONREACTIVE

## 2012-03-02 LAB — AMNISURE RUPTURE OF MEMBRANE (ROM) NOT AT ARMC: Amnisure ROM: POSITIVE

## 2012-03-02 MED ORDER — OXYTOCIN 20 UNITS IN LACTATED RINGERS INFUSION - SIMPLE: 125.0000 mL/h | Freq: Once | INTRAVENOUS | Status: DC

## 2012-03-02 MED ORDER — LIDOCAINE HCL (PF) 1 % IJ SOLN
INTRAMUSCULAR | Status: DC | PRN
  Administered 2012-03-02 (×3): 4 mL

## 2012-03-02 MED ORDER — LIDOCAINE HCL (PF) 1 % IJ SOLN
30.0000 mL | INTRAMUSCULAR | Status: DC | PRN
  Filled 2012-03-02: qty 30

## 2012-03-02 MED ORDER — OXYCODONE-ACETAMINOPHEN 5-325 MG PO TABS: 1.0000 | ORAL_TABLET | ORAL | Status: DC | PRN

## 2012-03-02 MED ORDER — CITRIC ACID-SODIUM CITRATE 334-500 MG/5ML PO SOLN: 30.0000 mL | ORAL | Status: DC | PRN

## 2012-03-02 MED ORDER — PHENYLEPHRINE 40 MCG/ML (10ML) SYRINGE FOR IV PUSH (FOR BLOOD PRESSURE SUPPORT)
80.0000 ug | PREFILLED_SYRINGE | INTRAVENOUS | Status: DC | PRN
  Filled 2012-03-02: qty 5

## 2012-03-02 MED ORDER — PROMETHAZINE HCL 25 MG/ML IJ SOLN: 12.5000 mg | INTRAMUSCULAR | Status: DC | PRN

## 2012-03-02 MED ORDER — IBUPROFEN 600 MG PO TABS: 600.0000 mg | ORAL_TABLET | Freq: Four times a day (QID) | ORAL | Status: DC | PRN

## 2012-03-02 MED ORDER — FLEET ENEMA 7-19 GM/118ML RE ENEM: 1.0000 | ENEMA | RECTAL | Status: DC | PRN

## 2012-03-02 MED ORDER — TERBUTALINE SULFATE 1 MG/ML IJ SOLN: 0.2500 mg | Freq: Once | INTRAMUSCULAR | Status: AC | PRN

## 2012-03-02 MED ORDER — BUTORPHANOL TARTRATE 2 MG/ML IJ SOLN
2.0000 mg | INTRAMUSCULAR | Status: DC | PRN
  Filled 2012-03-02: qty 1

## 2012-03-02 MED ORDER — OXYTOCIN 20 UNITS IN LACTATED RINGERS INFUSION - SIMPLE
1.0000 m[IU]/min | INTRAVENOUS | Status: DC
  Administered 2012-03-02: 1 m[IU]/min via INTRAVENOUS

## 2012-03-02 MED ORDER — ONDANSETRON HCL 4 MG/2ML IJ SOLN: 4.0000 mg | Freq: Four times a day (QID) | INTRAMUSCULAR | Status: DC | PRN

## 2012-03-02 MED ORDER — OXYTOCIN 20 UNITS IN LACTATED RINGERS INFUSION - SIMPLE
INTRAVENOUS | Status: AC
  Administered 2012-03-02: 1 m[IU]/min via INTRAVENOUS
  Filled 2012-03-02: qty 1000

## 2012-03-02 MED ORDER — FENTANYL 2.5 MCG/ML BUPIVACAINE 1/10 % EPIDURAL INFUSION (WH - ANES)
14.0000 mL/h | INTRAMUSCULAR | Status: DC
  Administered 2012-03-02: 14 mL/h via EPIDURAL
  Filled 2012-03-02: qty 60

## 2012-03-02 MED ORDER — EPHEDRINE 5 MG/ML INJ
10.0000 mg | INTRAVENOUS | Status: DC | PRN
  Filled 2012-03-02: qty 4

## 2012-03-02 MED ORDER — LACTATED RINGERS IV SOLN: 500.0000 mL | Freq: Once | INTRAVENOUS | Status: DC

## 2012-03-02 MED ORDER — OXYTOCIN BOLUS FROM INFUSION
500.0000 mL | Freq: Once | INTRAVENOUS | Status: DC
  Administered 2012-03-03: 500 mL via INTRAVENOUS
  Filled 2012-03-02: qty 500

## 2012-03-02 MED ORDER — PHENYLEPHRINE 40 MCG/ML (10ML) SYRINGE FOR IV PUSH (FOR BLOOD PRESSURE SUPPORT): 80.0000 ug | PREFILLED_SYRINGE | INTRAVENOUS | Status: DC | PRN

## 2012-03-02 MED ORDER — LACTATED RINGERS IV SOLN
INTRAVENOUS | Status: DC
  Administered 2012-03-02 (×2): 125 mL/h via INTRAVENOUS

## 2012-03-02 MED ORDER — LACTATED RINGERS IV SOLN: 500.0000 mL | INTRAVENOUS | Status: DC | PRN

## 2012-03-02 MED ORDER — EPHEDRINE 5 MG/ML INJ: 10.0000 mg | INTRAVENOUS | Status: DC | PRN

## 2012-03-02 MED ORDER — DIPHENHYDRAMINE HCL 50 MG/ML IJ SOLN: 12.5000 mg | INTRAMUSCULAR | Status: DC | PRN

## 2012-03-02 MED ORDER — ACETAMINOPHEN 325 MG PO TABS: 650.0000 mg | ORAL_TABLET | ORAL | Status: DC | PRN

## 2012-03-02 NOTE — Progress Notes (Signed)
Subjective: Breathing with her ctxs.  Pt's family had gone out for supper, and just returned to her bedside.  Pitocin on 73mu/min  Objective: BP 112/63  Pulse 91  Temp(Src) 97.7 F (36.5 C) (Oral)  Resp 18  Ht 5\' 5"  (1.651 m)  Wt 100.699 kg (222 lb)  BMI 36.94 kg/m2  SpO2 100%  LMP 05/22/2011      FHT:  FHR: 140 bpm, variability: moderate,  accelerations:  Present,  decelerations:  Present few earlies/ occ'l mild variable UC:   regular, every 2-4 minutes SVE:   Dilation: 6.5 Effacement (%): 100 Station: -2 Exam by:: H. Jaycie Kregel, CNM AR[facility] forewaters for large amt of clear fluid Labs: Lab Results  Component Value Date   WBC 13.2* 03/02/2012   HGB 11.2* 03/02/2012   HCT 34.1* 03/02/2012   MCV 90.5 03/02/2012   PLT 279 03/02/2012    Assessment / Plan: 1. 40.5  2. Prolonged ROM  3.  GBS neg  4. No s/s of infection  5. Just ruptured forewaters  Labor: active labor Preeclampsia:  no signs or symptoms of toxicity Fetal Wellbeing:  Category I Pain Control:  Labor support without medications I/D:  n/a Anticipated MOD:  NSVD 1.  Support as needed; will remain close, as pt feeling more pressure with time. 2.  Pt placed on opposite side. 3.  C/w MD prn Leahmarie Gasiorowski H 03/02/2012, 8:48 PM

## 2012-03-02 NOTE — Anesthesia Procedure Notes (Signed)
Epidural Patient location during procedure: OB Start time: 03/02/2012 11:19 PM Reason for block: procedure for pain  Staffing Performed by: anesthesiologist   Preanesthetic Checklist Completed: patient identified, site marked, surgical consent, pre-op evaluation, timeout performed, IV checked, risks and benefits discussed and monitors and equipment checked  Epidural Patient position: sitting Prep: site prepped and draped and DuraPrep Patient monitoring: continuous pulse ox and blood pressure Approach: midline Injection technique: LOR air  Needle:  Needle type: Tuohy  Needle gauge: 17 G Needle length: 9 cm Needle insertion depth: 6 cm Catheter type: closed end flexible Catheter size: 19 Gauge Catheter at skin depth: 11 cm Test dose: negative  Assessment Events: blood not aspirated, injection not painful, no injection resistance, negative IV test and no paresthesia  Additional Notes Discussed risk of headache, infection, bleeding, nerve injury and failed or incomplete block.  Patient voices understanding and wishes to proceed. \

## 2012-03-02 NOTE — Progress Notes (Signed)
Breathing well with uc O fhts 140s LTV min to mod     abd soft between uc     uc q 3-4  Mod     Vag 6 100 -2 ballotable A active labor P continue care Lavera Guise, CNM

## 2012-03-02 NOTE — Anesthesia Preprocedure Evaluation (Signed)
Anesthesia Evaluation  Patient identified by MRN, date of birth, ID band Patient awake    Reviewed: Allergy & Precautions, H&P , NPO status , Patient's Chart, lab work & pertinent test results, reviewed documented beta blocker date and time   History of Anesthesia Complications Negative for: history of anesthetic complications  Airway Mallampati: I TM Distance: >3 FB Neck ROM: full    Dental  (+) Teeth Intact   Pulmonary Current Smoker,  breath sounds clear to auscultation        Cardiovascular negative cardio ROS  Rhythm:regular Rate:Normal     Neuro/Psych PSYCHIATRIC DISORDERS (bipolar) negative neurological ROS     GI/Hepatic negative GI ROS, (+)     substance abuse (h/o)   ,   Endo/Other  negative endocrine ROS  Renal/GU negative Renal ROS  negative genitourinary   Musculoskeletal   Abdominal   Peds  Hematology negative hematology ROS (+)   Anesthesia Other Findings   Reproductive/Obstetrics (+) Pregnancy                           Anesthesia Physical Anesthesia Plan  ASA: II  Anesthesia Plan: Epidural   Post-op Pain Management:    Induction:   Airway Management Planned:   Additional Equipment:   Intra-op Plan:   Post-operative Plan:   Informed Consent: I have reviewed the patients History and Physical, chart, labs and discussed the procedure including the risks, benefits and alternatives for the proposed anesthesia with the patient or authorized representative who has indicated his/her understanding and acceptance.     Plan Discussed with:   Anesthesia Plan Comments:         Anesthesia Quick Evaluation

## 2012-03-02 NOTE — Progress Notes (Signed)
Breathing well with uc, some pressure O VSS      fhts category 1      uc q 3-4 mod      abd soft between uc      Vag 6-7 100 -2 ballotable VTX A PPROM    Probable inadequate uc P titrate pitocin, report to Paulette Blanch, CNM at [phone]. Lavera Guise, CNM

## 2012-03-02 NOTE — Progress Notes (Signed)
C/o of painful uc O calm, quiet resting in bed      fhts 140s LTV mod      uc q 2-4 mod      abd soft between uc      Vag foley bulb removed intact from vag vault      5 10 0 -2/-3 ballotable A active labor P instructed to call nurse if gush of water or pressure, denies need for medication Lavera Guise, CNM

## 2012-03-02 NOTE — H&P (Signed)
KAMIE KORBER is a 26 y.o. female g3p1 EDC 4/17, presenting for complaint of contractions for days, stronger now, water leaking x 1 at 0930 on 4/21, had some drops of red blood in toilet today, +FM. Hx of "THC use years ago and jail time for drug charges" Denies ha, visual spots or blurring, no upper abd pain, just swelling to ankles. Plans unmedicated labor. Problem list: Bipolar Tobacco use Obesity LGSIL with colpo Past Hx THC use History OB History    Grav Para Term Preterm Abortions TAB SAB Ect Mult Living   3 1 1  0 1 0 1 0 0 1    2004 SAB 1/06 Female 6#12 SVD adopted out, pg complicated by polyhydramnios then oligohydramnios Current uncomplcated  Past Medical History  Diagnosis Date  . Asthma   . Depression   . Bipolar 1 disorder   . History of chicken pox    Past Surgical History  Procedure Date  . No past surgeries   . Adenoidectomy   . Knee surgery 1999   Family History: family history includes Diabetes in her father, maternal grandmother, mother, and paternal aunt; Drug abuse in her mother; Heart disease in her mother; Hypertension in her father, maternal aunts, maternal grandmother, maternal uncle, mother, paternal aunt, and paternal grandmother; and Kidney disease in her maternal uncle. Social History:  reports that she has been smoking.  She does not have any smokeless tobacco history on file. She reports that she does not drink alcohol or use illicit drugs.  ROS  Dilation: 1 Effacement (%): Thick Station:  (high) Exam by:: M. Chiquita Loth CNM Blood pressure 128/86, pulse 92, temperature 98.1 F (36.7 C), temperature source Oral, resp. rate 18, height 5\' 5"  (1.651 m), weight 222 lb (100.699 kg), last menstrual period 05/22/2011, SpO2 100.00%. Exam Physical Exam Calm, no distress, lungs clear bilaterally, AP RRR, abd soft, gravid, nt, bowel sounds active trace edema to lower extremities, SSE on admission to triage with pink to brown discharge, + amnisure, vag now  1 60 -3 no fluid seen and foley bulb placed easily , VTX per bedside Ultrasound fhts category 1 uc irregular to q 6 mild Prenatal labs: ABO, Rh: A/Positive/-- (10/22 0000) Antibody: Negative (10/22 0000) Rubella: Immune (10/22 0000) RPR: Nonreactive (10/22 0000)  HBsAg: Negative (10/22 0000)  HIV: Non-reactive (10/22 0000)  GBS: NEGATIVE (04/08 1804)  US anatomy WNL  Assessment/Plan: 40 6/7 week IUP PPROM GBS negative 1205 admission, plan foley bulb and IV pitocin, pt concurs with plan of care, collaboration with Dr. Stefano Gaul per telephone.   Jumaane Weatherford 03/02/2012, 1:35 PM  Addendum: EFW 7#8

## 2012-03-02 NOTE — Progress Notes (Signed)
Subjective: Pt's ctx intensity has cont'd to increase since rupture of forewaters.  No pressure however.  S.o. And pt's mom remain at bedside.  Pt has changed from side to side, and now standing and swaying at bedside.  She states she is "tired."  Objective: BP 124/75  Pulse 83  Temp(Src) 98 F (36.7 C) (Oral)  Resp 18  Ht 5\' 5"  (1.651 m)  Wt 100.699 kg (222 lb)  BMI 36.94 kg/m2  SpO2 100%  LMP 05/22/2011      FHT:  FHR: 140 bpm, variability: moderate,  accelerations:  Present,  decelerations:  Absent UC:   regular, every 2-3 minutes SVE:   No cervical change and higher station -3 now.  Bedside ultrasound and confirmed vtx.  Assessment / Plan: 1. 40.5  2.  No cervical change over 3 hrs  3.  Fatigued  4.  Prolonged ROM  5. GBS neg  Labor: protracted active phase Preeclampsia:  no signs or symptoms of toxicity Fetal Wellbeing:  Category I Pain Control:  Labor support without medications I/D:  n/a Anticipated MOD:  NSVD 1.  Pt is coping less well and considering pain intervention 2.  Consider IUPC 3.  C/w MD prn 4.  Support as needed. Blake Vetrano H 03/02/2012, 9:59 PM

## 2012-03-02 NOTE — Telephone Encounter (Signed)
Preadmission screen  

## 2012-03-02 NOTE — Progress Notes (Signed)
Pt now requesting epidural.  Receiving bolus presently.  Has had 2 episodes of dry heaves.  Support persons remain at bedside and supportive.  FHT stable in 140s.  Pitocin increased about 1 hr ago to 10mu.  Plan IUPC after comfortable. C/w MD prn. Rexene Edison, CNM 03/02/12, 2300

## 2012-03-02 NOTE — MAU Note (Signed)
Patient states she has been having pain all night. Unable to determine of contractions. States she feel fetal movement but less that usual.

## 2012-03-03 ENCOUNTER — Encounter (HOSPITAL_COMMUNITY): Payer: Self-pay | Admitting: *Deleted

## 2012-03-03 DIAGNOSIS — O4210 Premature rupture of membranes, onset of labor more than 24 hours following rupture, unspecified weeks of gestation: Secondary | ICD-10-CM | POA: Diagnosis present

## 2012-03-03 LAB — CBC
Hemoglobin: 10.5 g/dL — ABNORMAL LOW (ref 12.0–15.0)
MCH: 29.7 pg (ref 26.0–34.0)
MCHC: 33.1 g/dL (ref 30.0–36.0)
RDW: 14.6 % (ref 11.5–15.5)

## 2012-03-03 MED ORDER — DIBUCAINE 1 % RE OINT: 1.0000 "application " | TOPICAL_OINTMENT | RECTAL | Status: DC | PRN

## 2012-03-03 MED ORDER — LANOLIN HYDROUS EX OINT: TOPICAL_OINTMENT | CUTANEOUS | Status: DC | PRN

## 2012-03-03 MED ORDER — DIPHENHYDRAMINE HCL 25 MG PO CAPS: 25.0000 mg | ORAL_CAPSULE | Freq: Four times a day (QID) | ORAL | Status: DC | PRN

## 2012-03-03 MED ORDER — ONDANSETRON HCL 4 MG PO TABS: 4.0000 mg | ORAL_TABLET | ORAL | Status: DC | PRN

## 2012-03-03 MED ORDER — METHYLERGONOVINE MALEATE 0.2 MG/ML IJ SOLN: 0.2000 mg | INTRAMUSCULAR | Status: DC | PRN

## 2012-03-03 MED ORDER — ZOLPIDEM TARTRATE 5 MG PO TABS: 5.0000 mg | ORAL_TABLET | Freq: Every evening | ORAL | Status: DC | PRN

## 2012-03-03 MED ORDER — ONDANSETRON HCL 4 MG/2ML IJ SOLN: 4.0000 mg | INTRAMUSCULAR | Status: DC | PRN

## 2012-03-03 MED ORDER — PRENATAL MULTIVITAMIN CH
1.0000 | ORAL_TABLET | Freq: Every day | ORAL | Status: DC
  Administered 2012-03-03 – 2012-03-04 (×2): 1 via ORAL
  Filled 2012-03-03 (×2): qty 1

## 2012-03-03 MED ORDER — SENNOSIDES-DOCUSATE SODIUM 8.6-50 MG PO TABS
2.0000 | ORAL_TABLET | Freq: Every day | ORAL | Status: DC
  Administered 2012-03-03: 2 via ORAL

## 2012-03-03 MED ORDER — MEDROXYPROGESTERONE ACETATE 150 MG/ML IM SUSP: 150.0000 mg | INTRAMUSCULAR | Status: DC | PRN

## 2012-03-03 MED ORDER — TETANUS-DIPHTH-ACELL PERTUSSIS 5-2.5-18.5 LF-MCG/0.5 IM SUSP
0.5000 mL | Freq: Once | INTRAMUSCULAR | Status: AC
  Administered 2012-03-04: 0.5 mL via INTRAMUSCULAR
  Filled 2012-03-03: qty 0.5

## 2012-03-03 MED ORDER — IBUPROFEN 600 MG PO TABS
600.0000 mg | ORAL_TABLET | Freq: Four times a day (QID) | ORAL | Status: DC
  Administered 2012-03-03 – 2012-03-04 (×6): 600 mg via ORAL
  Filled 2012-03-03 (×6): qty 1

## 2012-03-03 MED ORDER — BENZOCAINE-MENTHOL 20-0.5 % EX AERO: 1.0000 "application " | INHALATION_SPRAY | CUTANEOUS | Status: DC | PRN

## 2012-03-03 MED ORDER — MAGNESIUM HYDROXIDE 400 MG/5ML PO SUSP: 30.0000 mL | ORAL | Status: DC | PRN

## 2012-03-03 MED ORDER — OXYCODONE-ACETAMINOPHEN 5-325 MG PO TABS: 1.0000 | ORAL_TABLET | ORAL | Status: DC | PRN

## 2012-03-03 MED ORDER — WITCH HAZEL-GLYCERIN EX PADS: 1.0000 "application " | MEDICATED_PAD | CUTANEOUS | Status: DC | PRN

## 2012-03-03 MED ORDER — METHYLERGONOVINE MALEATE 0.2 MG PO TABS: 0.2000 mg | ORAL_TABLET | ORAL | Status: DC | PRN

## 2012-03-03 MED ORDER — SIMETHICONE 80 MG PO CHEW: 80.0000 mg | CHEWABLE_TABLET | ORAL | Status: DC | PRN

## 2012-03-03 NOTE — Anesthesia Postprocedure Evaluation (Signed)
  Anesthesia Post-op Note  Patient: Kim Briggs  Procedure(s) Performed: * No procedures listed *  Patient Location: Mother/Baby  Anesthesia Type: Epidural  Level of Consciousness: awake  Airway and Oxygen Therapy: Patient Spontanous Breathing  Post-op Pain: mild  Post-op Assessment: Patient's Cardiovascular Status Stable and Respiratory Function Stable  Post-op Vital Signs: stable  Complications: No apparent anesthesia complications

## 2012-03-03 NOTE — Progress Notes (Signed)
UR chart review completed.  

## 2012-03-03 NOTE — Progress Notes (Signed)
Clinical Social Work Department PSYCHOSOCIAL ASSESSMENT - MATERNAL/CHILD 03/03/2012  Patient:  Kim Briggs,Kim Briggs  Account Number:  [id number]  Admit Date:  03/02/2012  Childs Name:   Kim Briggs    Clinical Social Worker:  Nicholai Willette, LCSWA   Date/Time:  03/03/2012 11:30 AM  Date Referred:  03/03/2012      Referred reason  Behavioral Health Issues   Other referral source:    I:  FAMILY / HOME ENVIRONMENT Child's legal guardian:  PARENT  Guardian - Name Guardian - Age Guardian - Address  Lashun Hear[address] [place], [place] [address]  Donald Briggs 33 (same as above)   Other household support members/support persons Other support:   Dorothy Dunne-Owens, mother  Michael Owens, Step-father  FOB's family    II  PSYCHOSOCIAL DATA Information Source:  Patient Interview  Financial and Community Resources Employment:   Financial resources:  Medicaid If Medicaid - County:  [place] Other  WIC  Food Stamps   School / Grade:   Maternity Care Coordinator / Child Services Coordination / Early Interventions:  Cultural issues impacting care:    III  STRENGTHS Strengths  Adequate Resources  Home prepared for Child (including basic supplies)  Supportive family/friends   Strength comment:    IV  RISK FACTORS AND CURRENT PROBLEMS Current Problem:  YES   Risk Factor & Current Problem Patient Issue Family Issue Risk Factor / Current Problem Comment  Mental Illness Y N History of Bipolar disorder/ Depression   N N     V  SOCIAL WORK ASSESSMENT Sw referral received to assess pt's history of bipolar disorder and depression.  Pt states she was diagnosed with bipolar and depression at age "8 or 9," by a practitioner at the [facility]. Pt's symptoms were treated with medication until 2004, when she stopped taking the medicine.  According to the pt, she was able to cope "somewhat," but plans to follow up at the [facility] shortly  after discharge.  Pt told Sw that she wants to be reevaluated to make sure her mental health diagnoses are treated if needed, as a precaution.  While pt states she does not want to restart medication, she will if recommended.  Pt was hospitalized at [facility] in 1999, due to "behavioral problems."  She denies any recent hospitalizations or SI.  Pt was incarcerated for 4 1/2 months for assaulting a family member.  She is not currently on probation.  Pt had a daughter in 2006, of which she made a closed adoption plan with an agency.  She denies any substance use (MJ or ETOH) in 6 years.  Pt appears appropriate, as she answered all of this Sw's questions appropriately.  She reports having all the necessary supplies for the infant and adequate family support.  FOB at the bedside, asleep.  Sw discussed PP depression symptoms and encourage pt to follow through with her plan to go to [facility] for an evaluation.  Sw is available to assist further if needed.      VI SOCIAL WORK PLAN Social Work Plan  No Further Intervention Required / No Barriers to Discharge   Type of pt/family education:   If child protective services report - county:   If child protective services report - date:   Information/referral to community resources comment:   Other social work plan:      

## 2012-03-03 NOTE — Progress Notes (Signed)
Subjective: Pt getting comfortable s/p epidural; still feels ctxs, and in semi-reclining, feels suprapubic pressure.    Objective: BP 112/56  Pulse 101  Temp(Src) 97.9 F (36.6 C) (Oral)  Resp 18  Ht 5\' 5"  (1.651 m)  Wt 100.699 kg (222 lb)  BMI 36.94 kg/m2  SpO2 80%  LMP 05/22/2011      FHT:  FHR: 140 bpm, variability: moderate,  accelerations:  Present,  decelerations:  Present mild variables since epidural UC:   regular, every 2-3 minutes SVE:   Dilation: 7 Effacement (%): 100 Station: -1 Exam by:: Annalyssa Thune, CNM IUPC inserted.   Labs: Lab Results  Component Value Date   WBC 13.2* 03/02/2012   HGB 11.2* 03/02/2012   HCT 34.1* 03/02/2012   MCV 90.5 03/02/2012   PLT 279 03/02/2012    Assessment / Plan: Protracted active phase  Labor: prolonged ROM Preeclampsia:  no signs or symptoms of toxicity Fetal Wellbeing:  Category II Pain Control:  Epidural I/D:  n/a Anticipated MOD:  NSVD 1.  Titrate Pitocin to achieve adequacy if MVU's less than 180 2.  Recheck prn 3.  C/w MD prn Chantrell Apsey H 03/03/2012, 12:38 AM

## 2012-03-04 ENCOUNTER — Encounter: Payer: Medicaid Other | Admitting: Obstetrics and Gynecology

## 2012-03-04 ENCOUNTER — Other Ambulatory Visit: Payer: Medicaid Other

## 2012-03-04 MED ORDER — IBUPROFEN 600 MG PO TABS: 600.0000 mg | ORAL_TABLET | Freq: Four times a day (QID) | ORAL | Status: AC | PRN

## 2012-03-04 NOTE — Discharge Instructions (Signed)
Postpartum Care After Vaginal Delivery After you deliver your baby, you will stay in the hospital for 24 to 72 hours, unless there were problems with the labor or delivery, or you have medical problems. While you are in the hospital, you will receive help and instructions on how to care for yourself and your baby. Your doctor will order pain medicine, in case you need it. You will have a small amount of bleeding from your vagina and should change your sanitary pad frequently. Wash your hands thoroughly with soap and water for at least 20 seconds after changing pads and using the toilet. Let the nurses know if you begin to pass blood clots or your bleeding increases. Do not flush blood clots down the toilet before having the nurse look at them, to make sure there is no placental tissue with them. If you had an intravenous (IV), it will be removed within 24 hours, if there are no problems. The first time you get out of bed or take a shower, call the nurse to help you because you may get weak, lightheaded, or even faint. If you are breastfeeding, you may feel painful contractions of your uterus for a couple of weeks. This is normal. The contractions help your uterus get back to normal size. If you are not breastfeeding, wear a supportive bra and handle your breasts as little as possible until your milk has dried up. Hormones should not be given to dry up the breasts, because they can cause blood clots. You will be given your normal diet, unless you have diabetes or other medical problems.  The nurses may put an ice pack on your episiotomy (surgically enlarged opening), if you have one, to reduce the pain and swelling. On rare occasions, you may not be able to urinate and the nurse will need to empty your bladder with a catheter. If you had a postpartum tubal ligation ("tying tubes," female sterilization), it should not make your stay in the hospital longer. You may have your baby in your room with you as much as  you like, unless you or the baby has a problem. Use the bassinet (basket) for the baby when going to and from the nursery. Do not carry the baby. Do not leave the postpartum area. If the mother is Rh negative (lacks a protein on the red blood cells) and the baby is Rh positive, the mother should get a Rho-gam shot to prevent Rh problems with future pregnancies. You may be given written instructions for you and your baby, and necessary medicines, when you are discharged from the hospital. Be sure you understand and follow the instructions as advised. HOME CARE INSTRUCTIONS   Follow instructions and take the medicines given to you.   Only take over-the-counter or prescription medicines for pain, discomfort, or fever as directed by your caregiver.   Do not take aspirin, because it can cause bleeding.   Increase your activities a little bit every day to build up your strength and endurance.   Do not drink alcohol, especially if you are breastfeeding or taking pain medicine.   Take your temperature twice a day and record it.   You may have a small amount of bleeding or spotting for 2 to 4 weeks. This is normal.   Do not use tampons or douche. Use sanitary pads.   Try to have someone stay and help you for a few days when you go home.   Try to rest or take a nap when   the baby is sleeping.   If you are breastfeeding, wear a good support bra. If you are not breastfeeding, wear a supportive bra and do not stimulate your nipples.   Eat a healthy, nutritious diet and continue to take your prenatal vitamins.   Do not drive, do any heavy activities, or travel until your caregiver tells you it is okay.   Do not have intercourse until your caregiver gives you permission to do so.   Ask your caregiver when you can begin to exercise and what type of exercises to do.   Call your caregiver if you think you are having a problem from your delivery.   Call your pediatrician if you are having a problem  with the baby.   Schedule your postpartum visit and keep it.  SEEK MEDICAL CARE IF:   You have a temperature of 100 F (37.8 C) or higher.   You have increased vaginal bleeding or are passing clots. Save any clots to show your caregiver.   You have bloody urine or pain when you urinate.   You have a bad smelling vaginal discharge.   You have increasing pain or swelling on your episiotomy.   You develop a severe headache.   You feel depressed.   The episiotomy is separating.   You become dizzy or lightheaded.   You develop a rash.   You have a reaction or problems with your medicine.   You have pain, redness, or swelling at the intravenous site.  SEEK IMMEDIATE MEDICAL CARE IF:   You have chest pain.   You develop shortness of breath.   You pass out.   You develop pain, with or without swelling or redness in your leg.   You develop heavy vaginal bleeding, with or without blood clots.   You develop stomach pain.   You develop a bad smelling vaginal discharge.  MAKE SURE YOU:   Understand these instructions.   Will watch your condition.   Will get help right away if you are not doing well or get worse.  Document Released: 08/24/2007 Document Revised: 10/16/2011 Document Reviewed: 09/05/2009 [employer] Patient Information 2012 [employer]  Breastfeeding Challenges Breastfeeding is often the best way to feed your baby. Challenges may discourage you from breastfeeding. But solutions can usually be found to help you. Some babies have conditions that may interfere with or make breastfeeding more difficult. But, in all of the following cases, breastfeeding is still best for a baby's health.  ADVANTAGES OF BREASTFEEDING  Breastfed babies tend to be healthier and less affected by disease.   Breastfed babies may have better brain development and be less likely to be overweight than formulafed babies.   Breastfeeding also benefits the mother. It will give you time  to be close to your baby and helps create a strong bond. It also:   Delays the return of your periods.   Stimulates your uterus to contract back to normal.   Helps you lose some of the weight you gained during pregnancy.   Breastfeeding is also cheaper than formula feeding. It also does not require mixing formula, heating bottles, or washing extra dishes.   Breastfeeding mothers have a lower risk of developing breast cancer.   Breastfeeding should be encouraged in women with gestational diabetes and diabetes type I and type II.  BREASTFEEDING CHALLENGES  Breastfeeding involves taking the time to get to know your baby's patterns and responding to his or her cues. Once breastfeeding is well established, feedings usually   become more regular and more widely spaced. Some mothers do not nurse their babies because they come across problems early on. If at all possible, begin breastfeeding your baby within an hour after delivery. The first milk you produce is called colostrum. It is packed with nutrients and disease-fighting substances. These will help nourish and protect your baby against infections as he or she grows up.   Some babies are unable to breastfeed because of premature birth and small size along with weakness and difficulty sucking. Sometimes with birth defects of the mouth (cleft lip or cleft palate) the mother may be able to pump breastmilk to give to her baby. Some digestive problems (breast milk jaundice, galactosemia) may be reasons not to nurse. See a lactation consultant if you have a breast infection or breast abscess, breast cancer or other cancer, previous surgery or radiation treatment, or inadequate milk supply (uncommon).  SOME MOTHERS ARE ADVISED NOT TO BREASTFEED DUE TO HEALTH PROBLEMS SUCH AS:  Serious illnesses.   Severe malnutrition.   Undergoing radiation therapy.   Taking psychiatric medication.   Active herpes lesions on the breast.   Chickenpox or shingles.     Active, untreated tuberculosis.   HIV (human immunodeficiency virus) infection.   Drug or alcohol addiction.   Undergoing radioactive iodine therapy.   Leukemia human cell Type I or II.  BREASTFEEDING SHOULD NOT BE PAINFUL  It is natural for minor problems to arise in first time breastfeeding. Problems you may have and some solutions are as follows:   Nipple soreness may be caused by:   Improper position of baby.   Improper feeding techniques.   Improper nipple care.   For many women, there is no identified cause. A simple change in your baby's position while feeding may relieve nipple soreness. Some breastfeeding mothers report nipple soreness only during the initial adjustment period.   If there is tenderness at first, it should gradually go away as the days go by. Poor latch-on and positioning are common causes of sore nipples. This is usually because the baby is not getting enough of the areola into his or her mouth, and is sucking mostly on the nipple. The areola is the colored portion around the nipple. In general, nurse early and often. Nurse with the nipple and areola in the baby's mouth, not just the nipple. And feed your baby on demand.   If you have sore nipples and put off feedings because of the pain, this can lead to your breasts becoming overly full. This may lead to plugged milk ducts in the breast followed by engorgement or even infection of the breasts. If your baby is latched on correctly, he or she should be able to nurse as long as needed without causing any pain. If it hurts, take the baby off of your breast and try again. Ask for help if it is still painful for you.   Check the positioning of your baby's body and the way she latches on and sucks. To minimize soreness, your baby's mouth should be open wide with as much of the areola in his or her mouth as possible. You should find that it feels better right away once the baby is positioned correctly.   Do not  delay feedings. Try to relax so your let-down reflex comes easily. You also can hand-express a little milk before beginning the feeding so your baby does not clamp down harder, waiting for the milk to come.   If your nipples are very   sore, it may be helpful to change positions each time you nurse. This puts the pressure on a different part of the nipple.   After nursing, you can also express a few drops of milk and gently rub it on your nipples. Human milk has natural healing properties. Let your nipples air-dry after feeding, or wear a soft-cotton shirt.   Wearing a nipple shield during nursing will not relieve sore nipples. They actually can prolong soreness by making it hard for the baby to learn to nurse without the shield.   Avoid wearing bras or clothes that are too tight and put pressure on your nipples. If you wear a bra, get one that offers good support to your breasts.   Change nursing pads often to avoid trapping in moisture. Only use cotton pads.   Avoid using soap, ointments or other chemicals on your nipples. Make sure to avoid products that must be removed before nursing. Washing with clean water is all that is necessary to keep your nipples and breasts clean.   Try rubbing pure lanolin on your nipples after breastfeeding to soothe the pain.   Making sure you get enough rest, eating healthy foods, and getting enough fluids also can help the healing process. If you have very sore nipples, you can ask your caregiver about using non-aspirin pain relievers.   Another cause of sore nursing is a condition called thrush. This is a fungal infection that can form on your nipples from the milk. Other signs of thrush include itching, flaking and drying skin, tender or pink skin. The infection also can form in the baby's mouth from having contact with your nipples. There it appears as little white spots on the inside of the cheeks, gums, or tongue. It also can appear as a diaper rash on your  baby that will not go away by using regular diaper rash ointments. If you have any of these symptoms or think you have thrush, contact your doctor and your baby's doctor, or a lactation consultant. A lactation consultant is a breastfeeding consultant or expert. You can get medication for your nipples and for your baby.   If you still have sore nipples after following the above tips, you may need to see someone who is trained in breastfeeding, like a lactation consultant.  ENGORGEMENT Engorgement is a condition after pregnancy, when your breasts feel very hard and painful. You also may have breast swelling, tenderness, warmth, redness, throbbing and flattening of the nipple. Engorgement may cause a low-grade fever. This can be confused with a breast infection. Engorgement is the result of the milk building up. It usually happens during the third to fifth day after birth. This slows circulation. When blood and lymph move through the breasts, fluid from the blood vessels can seep into the breast tissues. All of the following can cause engorgement.  Poor positioning.   Infrequent feedings.   Giving supplementary bottles of water, juice, formula, or breast milk or using a pacifier. All of these cut down on your feeding and may lead to engorgement.   Changing the breastfeeding schedule with decreasing in feeding.   The baby changes the nursing pattern.   Having a baby with a weak suck who is not able to nurse effectively.   Fatigue, stress, or anemia in the mother.   An overabundant milk supply.   Nipple damage.   Breast abnormalities.  Engorgement can lead to plugged ducts or a breast infection. So it is important to try to prevent   it before this happens. If treated properly, engorgement should only last for one to two days.  Minimize engorgement by making sure the baby is latched on and positioned correctly at your breast.   Nurse frequently after birth. Allow the baby to nurse as long as  he/she likes, as long as he/she is latched on well and sucking effectively.   In the early days when your milk is coming in, you should awaken a sleepy baby every 2 to 3 hours to breastfeed. Breastfeeding often on the affected side helps to remove the milk and keeps milk moving freely. This prevents overfilling of the breast.   Avoid additional bottles and pacifiers.   Try hand expressing or pumping a little milk to first soften the breast, areola, and nipple before breastfeeding. Or massage the breast before feeding.   Cold compresses in between feedings can help ease pain and swelling.   If you are returning to work, try to pump your milk on the same schedule your baby was fed.   Eat a well balanced diet and drink plenty of fluids.   Use a well-fitting, supportive bra that is not too tight.   If your engorgement lasts for more than two days even after treating it, contact a lactation consultant.   Use a breast pump to keep up with your nursing schedule.   Use a breast pump if your baby is not taking enough milk or you feel you may be getting engorged.  PLUGGED DUCTS AND INFECTION Plugged ducts and breast infection (mastitis) are common sources of sore breasts postpartum. It is common for many women to have a plugged duct in the breast at some point if breastfeeding.   A plugged milk duct feels like a tender, sore, lump in the breast. It is not accompanied by a fever or other symptoms. It happens when a milk duct does not properly drain. Then, pressure builds up behind the plug, and surrounding tissue becomes inflamed. A plugged duct usually only occurs in one breast at a time.   A breast infection (mastitis), on the other hand, is soreness or a lump in the breast that can be accompanied by a fever and/or flu-like symptoms. You may feel run down or very achy. Some women with a breast infection also have nausea and vomiting. You also may have yellowish discharge from the nipple that looks  like colostrum. Or the breasts feel warm or hot to the touch and appear pink or red. A breast infection can occur when other family members have a cold or the flu. Like a plugged duct, it usually only occurs in one breast. It is not always easy to tell the difference between a breast infection and a plugged duct. Both have similar symptoms and can improve within 24 to 48 hours.   Treatment for plugged ducts and breast infections is similar. But some breast infections need to also be treated with an antibiotic.   If mastitis is not treated quickly, it may lead to a breast abscess.   It may help to massage the area, starting behind the sore spot. Use your fingers in a circular motion and massage toward the nipple.   Breastfeed more often on the affected side. This helps loosen the plug, keeps the milk moving freely, and the breast from becoming overly full. Nursing every two hours, both day and night on the affected side first can be helpful.   Getting extra sleep or relaxing with your feet up can help speed   healing. Often a plugged duct or breast infection is the first sign that a mother is doing too much and becoming overly tired.   Wear a well-fitting supportive bra that is not too tight, since this can constrict milk ducts.   If you do not feel better within 24 hours of trying these steps, and you have a fever or your symptoms worsen, call your doctor. You may need an antibiotic. Also, if you have a breast infection in which both breasts look affected, or there is pus or blood in the milk, red streaks near the area, or your symptoms came on severely and suddenly, see your doctor right away.   Even if you need an antibiotic, continuing to breastfeed during treatment is best for both you and your baby. Most antibiotics will not affect your baby through your breast milk.  THRUSH Thrush (yeast) is a fungal infection that can form on your nipples or in your breast because it thrives on milk. The  infection forms from an overgrowth of the candida organism. Candida usually exists in our bodies and is kept at healthy levels by the natural bacteria in our bodies. But, when the natural balance of bacteria is upset, candida can overgrow, causing an infection. Some of the things that can cause thrush include:   Having an overly moist environment on your skin or nipples that are sore or cracked.   Taking antibiotics, birth control pills or steroids.   Having a diet that contains large amounts of sugar or foods with yeast.   Having a chronic illness like HIV infection, diabetes, or anemia.  If you have sore nipples that last more than a few days even after you make sure your baby's latch and positioning is correct, or you suddenly get sore nipples after several weeks of unpainful nursing, you could have thrush. Some other signs of thrush include:   Pink, flaky, shiny, itchy or cracked nipples.   Deep pink and blistered nipples.   You also could have shooting pains deep in the breast during or after feedings, or achy breasts.  The infection also can form in your baby's mouth from having contact with your nipples, and appear as little white spots on the inside of the cheeks, gums, or tongue. It also can appear as a diaper rash (small red dots around a rash) on your baby that will not go away by using regular diaper rash ointments. Many babies with thrush refuse to nurse, or are gassy or cranky.  Solution:   If you or your baby have any of these symptoms, contact your doctor and your baby's doctor so you both can be correctly diagnosed.   You can get medication for your nipples and for your baby. Medication for a mother is usually an ointment for the nipples. Your baby can be given a liquid medication for his/her mouth, and/or an ointment for any diaper rash.   Change disposable nursing pads often. Wash any towels or clothing that come in contact with the yeast in very hot water (above 122 F or  50 C).   Wear a clean bra every day. Wash your hands often, and wash your baby's hands often, especially if he or she sucks on his/her fingers.   Only use cotton pads.   Boil any pacifiers, bottle nipples, or toys your baby puts in his or her mouth once a day for 20 minutes to kill the thrush. After one week of treatment, discard pacifiers and nipples and buy new   ones.   Boil daily for 20 minutes all breast pump parts that touch the milk.   Make sure other family members are free of thrush or other fungal infections. If they have symptoms, get them treatment.  NURSING STRIKE A nursing strike is when your baby has been nursing well for months, then suddenly loses interest in breastfeeding and begins to refuse the breast. A nursing strike can mean several things are happening with your baby and that she or he is trying to communicate with you to let you know that something is wrong. Not all babies will react the same to different situations that can cause a nursing strike. Some will continue to breastfeed without a problem, others may just become fussy at the breast, and others will refuse the breast entirely. Some of the major causes of a nursing strike include:  Mouth pain from teething, a fungal infection, or a cold sore.   An ear infection.   Pain from a certain nursing position.   Being upset about a long separation from the mother or a major change in routine.   Being interested in other things around him or her.   A cold or stuffy nose that makes breathing difficult.   Reduced milk supply from supplementing with bottles or overuse of a pacifier.   Responding to the mother's strong reaction if the baby has bitten her.   Being upset about hearing arguing or people talking in a harsh voice with other family members while nursing.   Reacting to stress, over-stimulation, or having been repeatedly put off when wanting to nurse.   If your baby is on a nursing strike, it is normal to  feel frustrated and upset, especially if your baby is unhappy. It is important not to feel guilty or that you have done something wrong. Your breasts also may become uncomfortable as the milk builds up.  Treatment  Try to express your milk manually or with a breast pump on the same schedule as the baby used to breastfeed to avoid engorgement and plugged ducts.   Try another feeding method temporarily to give your baby your milk, such as a cup, dropper, or spoon. Keep track of your baby's wet diapers to make sure he/she is getting enough milk (5 to 6 per day).   Keep offering your breast to the baby. If the baby is frustrated, stop and try again later. Try when the baby is sleeping or very sleepy.   Try various breastfeeding positions.   Focus on the baby with all of your attention and comfort him or her with extra touching and cuddling.   Try nursing while rocking and in a quiet room free of distractions.  INVERTED OR LARGE NIPPLES Some women have nipples that naturally are inverted, or that turn inward instead of protruding, or that are flat and do not protrude. Inverted or flat nipples can sometimes make it harder to breastfeed because your baby can have a harder time latching on. But remember that for breastfeeding to work, your baby has to latch on to both the nipple and the breast, so even inverted nipples can work just fine. Very large nipples can make it hard for the baby to get enough of the areola into his or her mouth to compress the milk ducts and get enough milk.  Know what type of nipples you have before you have your baby, so you can be prepared in case you have a problem getting your baby to latch on   correctly.   Talk with a lactation consultant at the hospital or at a breastfeeding clinic for extra help if you have flat, inverted, or very large nipples.   Sometimes a lactation consultant can help inverted nipples to be pulled out with a small device before your baby is brought to  your breast.   In many cases, inverted nipples will protrude more as the baby starts to latch on and as time passes. The baby's sucking will help.   Flat nipples cause fewer problems than inverted nipples. Good latch-on and positioning are usually enough to ensure that a baby latched to a flat nipple breastfeeds well.   The latch for babies of mothers with very large nipples will improve with time as the baby grows. In some cases, it might take several weeks to get the baby to latch well. A good milk supply helps insure that her baby will get enough milk.  RETURNING TO WORK More and more women are breastfeeding when they return to work because they believe in the benefits of breastfeeding. You can purchase or rent effective breast pumps and storage containers for your milk. Many employers are willing to set up special rooms for mothers who pump. But others are not as educated about the benefits of breastfeeding. Also, many women are not able to take off as much time as they'd like after having their babies and might have to return to work before breastfeeding is well established. After you have your baby, take as much time off as possible. This will help you get your breastfeeding established well and may reduce the number of months you may need to pump your milk while you are at work.   If you plan to have your baby take a bottle of expressed breast milk while you are at work, you can introduce your baby to a bottle when he or she is around four weeks old. Otherwise, the baby might not accept the bottle later on. Once your baby is comfortable taking a bottle, it is a good idea to have Dad or another family member offer a bottle of pumped breast milk on a regular basis so the baby stays in practice.   Let your employer or human resources manager know that you plan to continue breastfeeding once you return to work. Before you return to work, or even before you have your baby, start talking with your  employer about breastfeeding. Do not be afraid to request a clean and private area where you can pump your milk. If you do not have your own office space, you can ask to use a supervisor's office during certain times. Or you can ask to have a clean, clutter free corner of a storage room. All you need is a chair, a small table, and an outlet if you are using an electric pump. Many electric pumps also can run on batteries and do not require an outlet. You can lock the door and place a small sign on it that asks for some privacy. You can pump your breast milk during lunch or other breaks. You could suggest to your employer that you are willing to make up work time for time spent pumping milk.   After pumping, you can refrigerate your milk, place it in a cooler, or freeze it for the baby to be fed later. Many breast pumps come with carrying cases that have a section to store your milk with ice packs. If you do not have access to a refrigerator,   you can leave it at room temperatures for up to 6 hours.   Many employers are not aware of state laws that state they have to allow you to breastfeed at your job. Under these laws, your employer is required to set up a space for you to breastfeed and/or allow paid/unpaid time for breastfeeding employees. To see if your state has a breastfeeding law for employers, go to http://www.llli.org/Law/LawBills.html or call us at 1-800-LALECHE (in [place]).  JAUNDICE  Jaundice is a condition that is common in many newborns. It appears as a yellowing of the skin and eyes. It is caused by an excess of bilirubin, a yellow pigment that is a product in the blood. All babies are born with extra red blood cells that undergo a process of being broken down and eliminated from the body. Bilirubin levels in the blood can be high because of:  Higher production of it in a newborn.   Increased ability of the newborn intestine to absorb it.   Limited ability of the newborn liver to handle large  amounts of it.  Many cases of jaundice do not need to be treated. Your baby's doctor will carefully monitor your baby's bilirubin levels. Sometimes infants have to be temporarily separated from their mothers to receive special treatment with phototherapy (aiming lights on the baby). In these cases, breastfeeding is encouraged but supplements may also be given to the baby. American Academy of Pediatrics advises against stopping breastfeeding in jaundiced babies and suggests continuing frequent breastfeeding, even during treatment. If your baby is jaundiced or develops jaundice, it is important to discuss with your baby's caregiver all possible treatment options. Share that you do not want to interrupt nursing if this is at all possible.  REFLUX  It is not unusual for babies to spit up after nursing. Usually, babies can spit up and show no other signs of illness. The spitting up disappears as the baby's digestive system matures. As long as the baby has 6 to 8 wet diapers and at least 2 bowel movements in a 24 hour period (under 6 weeks of age), and your baby is gaining weight (at least 4 ounces a week) you can be assured your baby is getting enough milk.  However, some babies have a condition called gastroesophageal reflux (GER). This happens when the muscle at the opening of the stomach opens at the wrong times, allowing milk and food to come back up into the the tube in the throat (esophagus). Symptoms of GER can include:   Crying as if in discomfort.   Waking up frequently at night.   Problems swallowing.   Frequent red or sore throat.   Signs of asthma, bronchitis, wheezing or problems breathing.   Projectile vomiting.   Arching of the back as if in severe pain.   Slow weight gain.   Gagging or choking.   Frequent hiccupping or burping.   Severe spitting up, or spitting up after every feeding, or hours after eating.   Refusal to eat.  Many healthy babies might have some of these  symptoms and do not have GER. But there are babies who might only have a few of these symptoms and have a severe case of GER. Not all babies with GER spit up or vomit.  Some babies with GER do not have a serious medical problem. But caring for them can be hard since they tend to be very fussy and wake up frequently at night. More severe cases of GER may need   to be treated with medication if the baby, in addition to spitting up, also refuses to nurse, gains weight poorly or is losing weight, or has periods of gagging or choking.   If your baby spits up after every feeding and has any of the other symptoms mentioned above, it is best to see his or her doctor for a correct diagnosis. Other than GER, your baby could have another condition that needs treatment. If there are no other signs of illness, he/she could just be sensitive to a food in your diet or a medication he/she is receiving. If your baby has GER, it is important to try to continue to breastfeed since breast milk still is more easily digested than formula. Try smaller, more frequent feedings, thorough burping, and putting the baby in an upright position during and after feedings.  CLEFT PALATE AND CLEFT LIP  Cleft palate and cleft lip are some of the most common birth defects that happen as a baby is developing in the womb. A cleft, or opening, in either the palate or lip can happen together or separately and both can be corrected through surgery. Both conditions can prevent babies from breastfeeding because a baby cannot form a good seal around the nipple and areola with his or her mouth, or get milk out of the breast well.   Cleft palate can happen on one or both sides of a baby's mouth and be partial or complete. Right after birth, a mother whose baby has a cleft palate can try to breastfeed her baby, and she can start expressing her milk right away to keep up her supply. Even if her baby cannot latch on well to her breast, the baby can be fed  breast milk by cup. In some hospitals, babies with cleft palate are fitted with a mouthpiece called an obturator. This fits into the cleft and seals it for easier feeding. The baby should be able to exclusively breastfeed after surgery.   Cleft lip can happen on one or both sides of a baby's lip. But a mother can try different breastfeeding positions and use her thumb or breast to help fill in the opening left by the lip to form a seal around the breast. With cleft lip repair, breastfeeding may only have to be stopped for a few hours.   If your baby is born with a cleft palate or cleft lip, talk with a lactation consultant in the hospital for assistance as soon as possible. Human milk and early breastfeeding is still best for your baby's health.  TWINS OR MULTIPLES Mothers of twins or multiples might feel overwhelmed with the idea of breastfeeding more than one baby at a time. The benefits of human milk to both these mothers and babies are the same as for all mothers and babies. When breastfeeding twins, your breast milk will increase to the amount the babies will need. You will have to take in more calories and liquids when nursing twins. If the babies are premature and unable to nurse, you can pump your breasts and freeze the milk until the babies are ready to nurse. But mothers of multiples get even more benefits from breastfeeding:  Their uterus contracts, which is helpful because it has stretched even more to hold more than one baby.   Hormones are released that relax the mother, which is helpful with the added stress of caring for more than one baby.   Eight to ten hours per week are saved because there is no   need to prepare formula or bottles and the mother's milk is available right away.   Breastfeeding early and often for a mother of multiples is important to keep up her milk supply. A good latch-on for each baby is important to avoid sore nipples. Many mothers find that it is easier to nurse  the babies together rather than separately, and that it gets easier as the babies get older. There are many breastfeeding holds that make it easier to nurse more than one baby at a time. If you are having multiples, talk with a lactation consultant about more ways you can successfully breastfeed your babies.  BREASTFEEDING DURING PREGNANCY While most mothers who are nursing a toddler stop breastfeeding if they find out they are pregnant, it is an individual choice to decide whether to keep breastfeeding during the pregnancy. It is not unsafe for the unborn child if you continue to breastfeed an older child during this time. But, if you are having some problems in your pregnancy such as uterine pain or bleeding, a history of preterm labor or problems gaining weight during pregnancy, your doctor may advise you to wean. Your child also may decide to wean on his or her own because pregnancy changes the amount and flavor of your milk. Some women also choose to wean at this time because they have nipple soreness caused by pregnancy hormones, are nauseous, tire more easily, or find that their growing stomachs make breastfeeding uncomfortable. You will need more calories when you breastfeed while pregnant. Your milk production usually slows down around the fourth month of pregnancy.  BREASTFEEDING AFTER BREAST SURGERY   If you have had breast surgery, including breast implants, you might be worried about whether you will be able to breastfeed. The most important things that affect whether you can produce enough milk for your baby are how your surgery was done and where your incisions are, and the reasons for your surgery. For example, women who have had incisions in the fold under the breasts are less likely to have problems producing milk than women who have had incisions around or across the areola. Incisions around the areola can cut into milk ducts and nerves, where milk is produced and travels. Women who have had  breast surgery to augment breasts that never fully developed may not have enough glands to produce a full milk supply.   If you had breast surgery and are worried about how it will affect breastfeeding, talk with a lactation consultant. If you are planning breast surgery and are worried about how it will affect breastfeeding, talk with your surgeon about ways he or she can preserve as much of the breast tissue and milk ducts as possible.  INDUCING LACTATION  Many mothers who adopt want to breastfeed their babies and can do it successfully with some help. It is successful ? to  of the time it is tried. Many will need to supplement their breast milk with donated breast milk or infant formula. But some adoptive mothers can breastfeed exclusively, especially if they have been pregnant before. Lactation is a hormonal response to a physical action. So the stimulation of the baby nursing causes the body to see a need for and produce milk. The more the baby nurses, the more a woman's body will produce milk.   Beginning a combination of hormone treatment several months before the baby is born should help facilitate lactation in many cases.   One thing you can do to prepare is to pump   every 3 hours around the clock for two to three weeks before your baby arrives. Or you can wait until the baby arrives and starts to nurse. Breast milk can be frozen until you are ready to nurse the baby. A supplemental nursing system (SNS) or a lactation aid can help ensure that your baby gets enough nutrition and that your breasts are stimulated to produce milk at the same time.   If you are an adoptive mother who wants to breastfeed, you should see both a lactation consultant and your doctor for help in establishing an initial milk supply.  FOR MORE INFORMATION [employer]: www.llli.org Document Released: 04/20/2006 Document Revised: 10/16/2011 Document Reviewed: 07/12/2008 Exit[employer] Patient Information 2012  [employer]. 
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
## 2012-03-09 ENCOUNTER — Telehealth: Payer: Self-pay | Admitting: Obstetrics and Gynecology

## 2012-11-15 ENCOUNTER — Emergency Department (HOSPITAL_COMMUNITY)
Admission: EM | Admit: 2012-11-15 | Discharge: 2012-11-15 | Disposition: A | Discharge status: Home / Self Care | Payer: Self-pay | Attending: Emergency Medicine | Admitting: Emergency Medicine

## 2012-11-15 ENCOUNTER — Encounter (HOSPITAL_COMMUNITY): Payer: Self-pay | Admitting: Emergency Medicine

## 2012-11-15 ENCOUNTER — Emergency Department (HOSPITAL_COMMUNITY): Payer: Self-pay

## 2012-11-15 DIAGNOSIS — Z8709 Personal history of other diseases of the respiratory system: Secondary | ICD-10-CM | POA: Insufficient documentation

## 2012-11-15 DIAGNOSIS — S8010XA Contusion of unspecified lower leg, initial encounter: Secondary | ICD-10-CM | POA: Insufficient documentation

## 2012-11-15 DIAGNOSIS — T7491XA Unspecified adult maltreatment, confirmed, initial encounter: Secondary | ICD-10-CM | POA: Insufficient documentation

## 2012-11-15 DIAGNOSIS — IMO0002 Reserved for concepts with insufficient information to code with codable children: Secondary | ICD-10-CM | POA: Insufficient documentation

## 2012-11-15 DIAGNOSIS — T07XXXA Unspecified multiple injuries, initial encounter: Secondary | ICD-10-CM

## 2012-11-15 DIAGNOSIS — Z8619 Personal history of other infectious and parasitic diseases: Secondary | ICD-10-CM | POA: Insufficient documentation

## 2012-11-15 DIAGNOSIS — Z8659 Personal history of other mental and behavioral disorders: Secondary | ICD-10-CM | POA: Insufficient documentation

## 2012-11-15 DIAGNOSIS — T7411XA Adult physical abuse, confirmed, initial encounter: Secondary | ICD-10-CM | POA: Insufficient documentation

## 2012-11-15 DIAGNOSIS — F17200 Nicotine dependence, unspecified, uncomplicated: Secondary | ICD-10-CM | POA: Insufficient documentation

## 2012-11-15 DIAGNOSIS — T7492XA Unspecified child maltreatment, confirmed, initial encounter: Secondary | ICD-10-CM | POA: Insufficient documentation

## 2012-11-15 DIAGNOSIS — Z87440 Personal history of urinary (tract) infections: Secondary | ICD-10-CM | POA: Insufficient documentation

## 2012-11-15 DIAGNOSIS — Z872 Personal history of diseases of the skin and subcutaneous tissue: Secondary | ICD-10-CM | POA: Insufficient documentation

## 2012-11-15 MED ORDER — OXYCODONE-ACETAMINOPHEN 5-325 MG PO TABS
1.0000 | ORAL_TABLET | Freq: Once | ORAL | Status: AC
  Administered 2012-11-15: 1 via ORAL
  Filled 2012-11-15: qty 1

## 2012-11-15 MED ORDER — NAPROXEN 500 MG PO TABS: 500.0000 mg | ORAL_TABLET | Freq: Two times a day (BID) | ORAL | Status: DC
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
## 2012-11-15 NOTE — Progress Notes (Signed)
CSW attempted to assess patient. Pt currently with officer completing statement. CSW will complete assessment at later time. Please call CSW when pt available.   Catha Gosselin, LCSWA  [phone] .11/15/2012 20:08pm
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
## 2012-11-15 NOTE — ED Notes (Signed)
MD at bedside. 

## 2012-11-15 NOTE — ED Notes (Signed)
Patient transported to X-ray 

## 2012-11-17 NOTE — ED Provider Notes (Signed)
Medical screening examination/treatment/procedure(s) were performed by non-physician practitioner and as supervising physician I was immediately available for consultation/collaboration.    Ehan Freas R Aubriauna Riner, MD 11/17/12 1508 

## 2012-12-14 LAB — OB RESULTS CONSOLE GC/CHLAMYDIA: Gonorrhea: NEGATIVE

## 2012-12-14 LAB — OB RESULTS CONSOLE HIV ANTIBODY (ROUTINE TESTING): HIV: NONREACTIVE

## 2012-12-18 ENCOUNTER — Inpatient Hospital Stay (HOSPITAL_COMMUNITY)
Admission: AD | Admit: 2012-12-18 | Discharge: 2012-12-18 | Disposition: A | Discharge status: Home / Self Care | Payer: Self-pay | Source: Ambulatory Visit | Attending: Obstetrics and Gynecology | Admitting: Obstetrics and Gynecology

## 2012-12-18 ENCOUNTER — Encounter (HOSPITAL_COMMUNITY): Payer: Self-pay | Admitting: Family

## 2012-12-18 DIAGNOSIS — N390 Urinary tract infection, site not specified: Secondary | ICD-10-CM | POA: Insufficient documentation

## 2012-12-18 DIAGNOSIS — O219 Vomiting of pregnancy, unspecified: Secondary | ICD-10-CM

## 2012-12-18 DIAGNOSIS — O210 Mild hyperemesis gravidarum: Secondary | ICD-10-CM | POA: Insufficient documentation

## 2012-12-18 DIAGNOSIS — O2390 Unspecified genitourinary tract infection in pregnancy, unspecified trimester: Secondary | ICD-10-CM | POA: Insufficient documentation

## 2012-12-18 LAB — URINALYSIS, ROUTINE W REFLEX MICROSCOPIC
Glucose, UA: NEGATIVE mg/dL
Hgb urine dipstick: NEGATIVE
Ketones, ur: 15 mg/dL — AB
Leukocytes, UA: NEGATIVE
Protein, ur: 100 mg/dL — AB
Urobilinogen, UA: 1 mg/dL (ref 0.0–1.0)

## 2012-12-18 LAB — URINE MICROSCOPIC-ADD ON

## 2012-12-18 MED ORDER — ONDANSETRON HCL 4 MG/2ML IJ SOLN
4.0000 mg | Freq: Once | INTRAMUSCULAR | Status: DC
  Filled 2012-12-18: qty 2

## 2012-12-18 MED ORDER — LACTATED RINGERS IV SOLN: Freq: Once | INTRAVENOUS | Status: DC

## 2012-12-18 MED ORDER — PROMETHAZINE HCL 25 MG/ML IJ SOLN
25.0000 mg | INTRAVENOUS | Status: DC
  Filled 2012-12-18: qty 1

## 2012-12-18 MED ORDER — ONDANSETRON 8 MG PO TBDP
8.0000 mg | ORAL_TABLET | Freq: Once | ORAL | Status: AC
  Administered 2012-12-18: 8 mg via ORAL
  Filled 2012-12-18: qty 1

## 2012-12-18 MED ORDER — DOXYLAMINE SUCCINATE (SLEEP) 25 MG PO TABS: 25.0000 mg | ORAL_TABLET | ORAL | Status: DC

## 2012-12-18 MED ORDER — VITAMIN B-6 25 MG PO TABS: 25.0000 mg | ORAL_TABLET | Freq: Three times a day (TID) | ORAL | Status: DC

## 2012-12-18 NOTE — MAU Note (Addendum)
Patient presents to MAU with c/o of N/V for approximately 4-5 days. Reports she believes she is pregnant. Patient has 8 mo infant, 29 yo and 54 yo children at home. Was not using birth control. LMP 10/31/12.

## 2012-12-18 NOTE — MAU Note (Signed)
Pt states that she is having nausea and vomiting and has not had a period since December.

## 2012-12-18 NOTE — MAU Provider Note (Signed)
History   CSN: [id number]  Arrival date and time: 12/18/12 1115   First Provider Initiated Contact with Patient 12/18/12 1214     Chief Complaint  Patient presents with  . Morning Sickness  . Possible Pregnancy   HPI Pt is a 26yo G4 P2,0,1,2 who presents to MAU with c/o possible pregnancy with no menses since 12/13 as well as nausea and vomiting.  States she has been unable to keep down any solids since yesterday.  States she has been able to keep down small amt of fluids.  She has not treated her symptoms and states she does have phenergan on hand but does not like to take due to side effects and little relief in the past.  Does not currently have insurance coverage.  Denies bldg.  Has some occas lower abd pain.  States she has not been using any contraception but pregnancy was unplanned.   OB History   Grav Para Term Preterm Abortions TAB SAB Ect Mult Living   4 2 2  0 1 0 1 0 0 2      Past Medical History  Diagnosis Date  . Asthma   . Depression   . Bipolar 1 disorder   . History of chicken pox   . H/O chlamydia infection 2000  . H/O candidiasis   . H/O cystitis     Past Surgical History  Procedure Laterality Date  . No past surgeries    . Adenoidectomy    . Knee surgery  1999    Family History  Problem Relation Age of Onset  . Hypertension Mother   . Diabetes Mother   . Heart disease Mother   . Drug abuse Mother   . Hypertension Father   . Diabetes Father   . Hypertension Maternal Aunt   . Hypertension Maternal Uncle   . Kidney disease Maternal Uncle     dialysis  . Hypertension Paternal Aunt   . Diabetes Paternal Aunt   . Hypertension Maternal Grandmother   . Diabetes Maternal Grandmother   . Hypertension Paternal Grandmother   . Hypertension Maternal Aunt     History  Substance Use Topics  . Smoking status: Current Every Day Smoker -- 0.50 packs/day  . Smokeless tobacco: Not on file  . Alcohol Use: No    Allergies: No Known Allergies  No  prescriptions prior to admission    Review of Systems  Constitutional: Positive for malaise/fatigue.  HENT: Negative.   Eyes: Negative.   Respiratory: Negative.   Cardiovascular: Negative.   Gastrointestinal: Positive for nausea, vomiting and abdominal pain. Negative for heartburn, diarrhea and constipation.  Genitourinary: Negative.   Musculoskeletal: Negative.   Skin: Negative.   Neurological: Negative.   Endo/Heme/Allergies: Negative.   Psychiatric/Behavioral: Negative.    Physical Exam   Blood pressure 115/61, pulse 84, temperature 97.3 F (36.3 C), temperature source Oral, resp. rate 16, height 5' 5.5" (1.664 m), weight 165 lb (74.844 kg), last menstrual period 10/31/2012, unknown if currently breastfeeding.  Physical Exam  Constitutional: She is oriented to person, place, and time. She appears well-developed and well-nourished.  HENT:  Head: Normocephalic and atraumatic.  Right Ear: External ear normal.  Left Ear: External ear normal.  Nose: Nose normal.  Eyes: Conjunctivae are normal. Pupils are equal, round, and reactive to light.  Neck: Normal range of motion. Neck supple. No thyromegaly present.  Cardiovascular: Normal rate, regular rhythm and intact distal pulses.   Respiratory: Effort normal and breath sounds normal.  GI: Soft. Bowel  sounds are normal. She exhibits no distension. There is no tenderness. There is no rebound and no guarding.  Genitourinary: Vagina normal and uterus normal.  Ext gent WNL.  Neg BUS.  Scant white discharge in vault. Neg CMT.  Ut anteverted, mobile, non-tender and c/w [redacted]wks gestation.  Adnexa without masses or tenderness.    Musculoskeletal: Normal range of motion.  Neurological: She is alert and oriented to person, place, and time. She has normal reflexes.  Skin: Skin is warm and dry.  Psychiatric: She has a normal mood and affect. Her behavior is normal.   Brief bedside transabdominal ultrasound with IUP present.  FHTs noted to be 149  bpm.  Fetal pole c/w 7wks present.  Yolk sac noted.  No adnexal abnormalities noted.  No free fluid in pelvis.   MAU Course  Procedures Results for orders placed during the hospital encounter of 12/18/12 (from the past 24 hour(s))  URINALYSIS, ROUTINE W REFLEX MICROSCOPIC     Status: Abnormal   Collection Time    12/18/12 11:25 AM      Result Value Range   Color, Urine YELLOW  YELLOW   APPearance CLEAR  CLEAR   Specific Gravity, Urine 1.025  1.005 - 1.030   pH 6.0  5.0 - 8.0   Glucose, UA NEGATIVE  NEGATIVE mg/dL   Hgb urine dipstick NEGATIVE  NEGATIVE   Bilirubin Urine SMALL (*) NEGATIVE   Ketones, ur 15 (*) NEGATIVE mg/dL   Protein, ur 161 (*) NEGATIVE mg/dL   Urobilinogen, UA 1.0  0.0 - 1.0 mg/dL   Nitrite POSITIVE (*) NEGATIVE   Leukocytes, UA NEGATIVE  NEGATIVE  URINE MICROSCOPIC-ADD ON     Status: Abnormal   Collection Time    12/18/12 11:25 AM      Result Value Range   Squamous Epithelial / LPF MANY (*) RARE   WBC, UA 3-6  <3 WBC/hpf   RBC / HPF 0-2  <3 RBC/hpf   Bacteria, UA RARE  RARE   Urine-Other MUCOUS PRESENT    POCT PREGNANCY, URINE     Status: Abnormal   Collection Time    12/18/12 11:30 AM      Result Value Range   Preg Test, Ur POSITIVE (*) NEGATIVE    Assessment and Plan  IUP at [redacted]w[redacted]d Nausea/Vomiting of pregnancy UTI   Consult with Dr. Su Hilt. Attempted IV placement for IVF and Zofran however unable to place after 6 attempts. Pt given Zofran 8mg  ODT with resolution of nausea and pt states no further nausea at time of discharge. Discharge to home. Rec Unisom at HS and Vit B6 25mg  tid OTC for nausea.  Info on nausea & vomiting in preg given. Rec begin PNV qd.   Pt to schedule an appt at [facility] as soon as possible. Rx Macrobid, #14, 1 cap po bid x 7 days (phone in)    Cattie Tineo O. 12/18/2012, 2:04 PM

## 2012-12-19 ENCOUNTER — Encounter (HOSPITAL_COMMUNITY): Payer: Self-pay | Admitting: Obstetrics and Gynecology

## 2012-12-21 ENCOUNTER — Encounter (HOSPITAL_COMMUNITY): Payer: Self-pay | Admitting: *Deleted

## 2012-12-21 ENCOUNTER — Emergency Department (HOSPITAL_COMMUNITY): Payer: Self-pay

## 2012-12-21 ENCOUNTER — Emergency Department (HOSPITAL_COMMUNITY)
Admission: EM | Admit: 2012-12-21 | Discharge: 2012-12-21 | Disposition: A | Discharge status: Home / Self Care | Payer: Self-pay | Attending: Emergency Medicine | Admitting: Emergency Medicine

## 2012-12-21 DIAGNOSIS — M79672 Pain in left foot: Secondary | ICD-10-CM

## 2012-12-21 DIAGNOSIS — Z8659 Personal history of other mental and behavioral disorders: Secondary | ICD-10-CM | POA: Insufficient documentation

## 2012-12-21 DIAGNOSIS — J45909 Unspecified asthma, uncomplicated: Secondary | ICD-10-CM | POA: Insufficient documentation

## 2012-12-21 DIAGNOSIS — F17200 Nicotine dependence, unspecified, uncomplicated: Secondary | ICD-10-CM | POA: Insufficient documentation

## 2012-12-21 DIAGNOSIS — Z8619 Personal history of other infectious and parasitic diseases: Secondary | ICD-10-CM | POA: Insufficient documentation

## 2012-12-21 DIAGNOSIS — Z8742 Personal history of other diseases of the female genital tract: Secondary | ICD-10-CM | POA: Insufficient documentation

## 2012-12-21 DIAGNOSIS — M79609 Pain in unspecified limb: Secondary | ICD-10-CM | POA: Insufficient documentation

## 2012-12-21 DIAGNOSIS — R110 Nausea: Secondary | ICD-10-CM | POA: Insufficient documentation

## 2012-12-21 NOTE — ED Notes (Signed)
Paged ortho 

## 2012-12-21 NOTE — ED Notes (Signed)
Pt leaving on crutches applied by ortho tech. Pt had ace bandage applied by ortho tech before leaving. Pt given d/c teaching and follow up care instructions. Pt verbalizes that she understands d/c teaching and need for follow up. Pt has no further questions upon d/c. Pt alert and mentating appropriately upon d/c. Pt does not show signs of acute distress upon d/c.

## 2012-12-21 NOTE — Progress Notes (Signed)
Orthopedic Tech Progress Note Patient Details:  Kim Briggs 07/18/1986 [id number]  Ortho Devices Type of Ortho Device: Ace wrap;Crutches Ortho Device/Splint Location: (R) LE Ortho Device/Splint Interventions: Application   Jennye Moccasin 12/21/2012, 8:22 PM
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
## 2012-12-21 NOTE — ED Notes (Signed)
Ortho tech at bedside 
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
## 2012-12-22 NOTE — ED Provider Notes (Signed)
Medical screening examination/treatment/procedure(s) were performed by non-physician practitioner and as supervising physician I was immediately available for consultation/collaboration.  Makyla Bye J. Jorita Bohanon, MD 12/22/12 2301 
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
## 2013-02-05 ENCOUNTER — Encounter (HOSPITAL_COMMUNITY): Payer: Self-pay | Admitting: *Deleted

## 2013-02-05 ENCOUNTER — Inpatient Hospital Stay (HOSPITAL_COMMUNITY)
Admission: AD | Admit: 2013-02-05 | Discharge: 2013-02-05 | Disposition: A | Discharge status: Home / Self Care | Payer: Medicaid Other | Source: Ambulatory Visit | Attending: Obstetrics & Gynecology | Admitting: Obstetrics & Gynecology

## 2013-02-05 DIAGNOSIS — A499 Bacterial infection, unspecified: Secondary | ICD-10-CM

## 2013-02-05 DIAGNOSIS — Z3482 Encounter for supervision of other normal pregnancy, second trimester: Secondary | ICD-10-CM

## 2013-02-05 DIAGNOSIS — O2390 Unspecified genitourinary tract infection in pregnancy, unspecified trimester: Secondary | ICD-10-CM | POA: Insufficient documentation

## 2013-02-05 DIAGNOSIS — N760 Acute vaginitis: Secondary | ICD-10-CM

## 2013-02-05 DIAGNOSIS — R109 Unspecified abdominal pain: Secondary | ICD-10-CM | POA: Insufficient documentation

## 2013-02-05 DIAGNOSIS — O209 Hemorrhage in early pregnancy, unspecified: Secondary | ICD-10-CM | POA: Insufficient documentation

## 2013-02-05 DIAGNOSIS — B9689 Other specified bacterial agents as the cause of diseases classified elsewhere: Secondary | ICD-10-CM | POA: Insufficient documentation

## 2013-02-05 HISTORY — DX: Reserved for concepts with insufficient information to code with codable children: IMO0002

## 2013-02-05 HISTORY — DX: Unspecified abnormal cytological findings in specimens from cervix uteri: R87.619

## 2013-02-05 LAB — URINALYSIS, ROUTINE W REFLEX MICROSCOPIC
Bilirubin Urine: NEGATIVE
Glucose, UA: NEGATIVE mg/dL
Hgb urine dipstick: NEGATIVE
Ketones, ur: NEGATIVE mg/dL
Leukocytes, UA: NEGATIVE
Nitrite: NEGATIVE
Protein, ur: NEGATIVE mg/dL
Specific Gravity, Urine: 1.02 (ref 1.005–1.030)
Urobilinogen, UA: 0.2 mg/dL (ref 0.0–1.0)
pH: 6.5 (ref 5.0–8.0)

## 2013-02-05 LAB — WET PREP, GENITAL
Trich, Wet Prep: NONE SEEN
Yeast Wet Prep HPF POC: NONE SEEN

## 2013-02-05 MED ORDER — METRONIDAZOLE 500 MG PO TABS: 500.0000 mg | ORAL_TABLET | Freq: Two times a day (BID) | ORAL | Status: DC

## 2013-02-05 NOTE — MAU Note (Signed)
States doesn't feel like she can get enough to drink. Doesn't want to drink sodas- "I know they will dehydrate you", just don't feel like I can get enough.  Denies hx of blood sugar problems, denies freq of urination.

## 2013-02-05 NOTE — MAU Note (Signed)
Name and DOB verified, pt confirms spelling is correct on armband 

## 2013-02-05 NOTE — MAU Note (Signed)
Pt reports having some spotting for the past couple of days none today. Reports mild occasional cramping as well.

## 2013-02-05 NOTE — MAU Note (Signed)
First noted on Wed, 3 nickel sized bright red spots.  Noted bright red spots again on Thurs, none since. Been having sharp pains on side and lower part of abd.  Feeling drained, feet swelling.

## 2013-02-05 NOTE — MAU Provider Note (Signed)
History     CSN: [id number]  Arrival date & time 02/05/13  1117   None     Chief Complaint  Patient presents with  . Vaginal Bleeding    (Consider location/radiation/quality/duration/timing/severity/associated sxs/prior treatment) HPI Kim Briggs is a 27 y.o. [id number] at [redacted]w[redacted]d. She presents with c/o bright red spotting 3/26 x 1 and 3/27 x 1. She has sharp low abd pains off/on- lasts < 1 m, occur 2-3x/day. Denies change in discharge, odor or itching. Is not voiding as much as usual, no UTI S&S. Last BM 4 d ay. Drinking 8-10 glasses of water/day. 1 partner x 3+ yr,he may have other partners. Plans PNC with [facility], awaiting Medicaid.  Past Medical History  Diagnosis Date  . Depression   . Bipolar 1 disorder   . History of chicken pox   . H/O chlamydia infection 2000  . H/O candidiasis   . H/O cystitis   . Asthma     when younger  . Abnormal Pap smear     f/u was wnl    Past Surgical History  Procedure Laterality Date  . Adenoidectomy      Family History  Problem Relation Age of Onset  . Hypertension Mother   . Diabetes Mother   . Heart disease Mother   . Drug abuse Mother   . Hypertension Father   . Diabetes Father   . Hypertension Maternal Aunt   . Hypertension Maternal Uncle   . Kidney disease Maternal Uncle     dialysis  . Hypertension Paternal Aunt   . Diabetes Paternal Aunt   . Hypertension Maternal Grandmother   . Diabetes Maternal Grandmother   . Hypertension Paternal Grandmother   . Hypertension Maternal Aunt     History  Substance Use Topics  . Smoking status: Current Every Day Smoker -- 0.50 packs/day for 13 years    Types: Cigarettes  . Smokeless tobacco: Never Used  . Alcohol Use: No    OB History   Grav Para Term Preterm Abortions TAB SAB Ect Mult Living   5 3 3  0 1 0 1 0 0 3      Review of Systems  Gastrointestinal: Positive for constipation. Negative for nausea, vomiting and diarrhea.  Genitourinary: Positive for vaginal  bleeding and pelvic pain. Negative for dysuria, urgency, frequency and vaginal discharge.    Allergies  Review of patient's allergies indicates no known allergies.  Home Medications  No current outpatient prescriptions on file.  BP 121/60  Pulse 92  Temp(Src) 98.3 F (36.8 C)  Resp 18  Ht 5\' 5"  (1.651 m)  Wt 176 lb 12.8 oz (80.196 kg)  BMI 29.42 kg/m2  LMP 10/31/2012  Physical Exam  Constitutional: She is oriented to person, place, and time. She appears well-developed and well-nourished.  Abdominal: Soft. There is no tenderness.  Genitourinary:  Pelvic Vulva- nl anatomy, skin intact Vagina-mod amt creamy, frothy discharge Cx- closed Uterus- 12-14 wk size, non tender  +FHR Adn- non tender   Musculoskeletal: Normal range of motion.  Neurological: She is alert and oriented to person, place, and time.  Skin: Skin is warm and dry.  Psychiatric: She has a normal mood and affect. Her behavior is normal.    ED Course  Procedures (including critical care time)  Labs Reviewed  URINALYSIS, ROUTINE W REFLEX MICROSCOPIC - Abnormal; Notable for the following:    APPearance HAZY (*)    All other components within normal limits  GLUCOSE, CAPILLARY   No results  found. Results for orders placed during the hospital encounter of 02/05/13 (from the past 24 hour(s))  URINALYSIS, ROUTINE W REFLEX MICROSCOPIC     Status: Abnormal   Collection Time    02/05/13 11:32 AM      Result Value Range   Color, Urine YELLOW  YELLOW   APPearance HAZY (*) CLEAR   Specific Gravity, Urine 1.020  1.005 - 1.030   pH 6.5  5.0 - 8.0   Glucose, UA NEGATIVE  NEGATIVE mg/dL   Hgb urine dipstick NEGATIVE  NEGATIVE   Bilirubin Urine NEGATIVE  NEGATIVE   Ketones, ur NEGATIVE  NEGATIVE mg/dL   Protein, ur NEGATIVE  NEGATIVE mg/dL   Urobilinogen, UA 0.2  0.0 - 1.0 mg/dL   Nitrite NEGATIVE  NEGATIVE   Leukocytes, UA NEGATIVE  NEGATIVE  GLUCOSE, CAPILLARY     Status: None   Collection Time    02/05/13  12:09 PM      Result Value Range   Glucose-Capillary 91  70 - 99 mg/dL   Comment 1 Notify RN    WET PREP, GENITAL     Status: Abnormal   Collection Time    02/05/13  1:45 PM      Result Value Range   Yeast Wet Prep HPF POC NONE SEEN  NONE SEEN   Trich, Wet Prep NONE SEEN  NONE SEEN   Clue Cells Wet Prep HPF POC FEW (*) NONE SEEN   WBC, Wet Prep HPF POC MANY (*) NONE SEEN     No diagnosis found.  A. No evidence bleeding today Bacterial vaginosis GC/CT pending Pregnant @13 + wks P.  Flagyl 500mg  bid x 7 Pt will be notified of culture results Make an appt for prenatal care with [facility] when gets Medicaid  MDM

## 2013-02-06 NOTE — MAU Provider Note (Signed)
Attestation of Attending Supervision of Advanced Practitioner (PA/CNM/NP): Evaluation and management procedures were performed by the Advanced Practitioner under my supervision and collaboration.  I have reviewed the Advanced Practitioner's note and chart, and I agree with the management and plan.  Seattle Dalporto, MD, FACOG Attending Obstetrician & Gynecologist Faculty Practice, [facility]  

## 2013-02-07 LAB — GC/CHLAMYDIA PROBE AMP
CT Probe RNA: NEGATIVE
GC Probe RNA: NEGATIVE

## 2013-03-13 ENCOUNTER — Inpatient Hospital Stay (HOSPITAL_COMMUNITY)
Admission: AD | Admit: 2013-03-13 | Discharge: 2013-03-13 | Disposition: A | Discharge status: Home / Self Care | Payer: Medicaid Other | Source: Ambulatory Visit | Attending: Obstetrics and Gynecology | Admitting: Obstetrics and Gynecology

## 2013-03-13 ENCOUNTER — Encounter (HOSPITAL_COMMUNITY): Payer: Self-pay | Admitting: Advanced Practice Midwife

## 2013-03-13 DIAGNOSIS — Z87898 Personal history of other specified conditions: Secondary | ICD-10-CM

## 2013-03-13 DIAGNOSIS — N10 Acute tubulo-interstitial nephritis: Secondary | ICD-10-CM | POA: Diagnosis not present

## 2013-03-13 DIAGNOSIS — O99891 Other specified diseases and conditions complicating pregnancy: Secondary | ICD-10-CM

## 2013-03-13 DIAGNOSIS — W57XXXA Bitten or stung by nonvenomous insect and other nonvenomous arthropods, initial encounter: Secondary | ICD-10-CM

## 2013-03-13 DIAGNOSIS — T148 Other injury of unspecified body region: Secondary | ICD-10-CM | POA: Insufficient documentation

## 2013-03-13 MED ORDER — TRIAMCINOLONE ACETONIDE 0.025 % EX CREA: TOPICAL_CREAM | Freq: Two times a day (BID) | CUTANEOUS | Status: DC

## 2013-03-13 MED ORDER — PERMETHRIN 5 % EX CREA: TOPICAL_CREAM | Freq: Once | CUTANEOUS | Status: DC

## 2013-03-13 NOTE — MAU Note (Signed)
Pt reports she is living in a [facility]. Has got many "insect bite on her arms torso and neck. Exterminator has checked building not found anything. Pt taking benadryl with minimal relief for itching.
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
## 2013-03-13 NOTE — MAU Provider Note (Signed)
Attestation of Attending Supervision of Advanced Practitioner: Evaluation and management procedures were performed by the PA/NP/CNM/OB Fellow under my supervision/collaboration. Chart reviewed and agree with management and plan.  Deangelo Berns V 03/13/2013 4:15 PM

## 2013-03-13 NOTE — MAU Note (Signed)
Kim Briggs is here with complaints of bites on her arms, and upper abdomen. She is currently living in a shelter and says another lady who is staying in her room is experiencing the same symptoms. She has been taking benadryl; every other night and feels it has helped.

## 2013-04-05 ENCOUNTER — Inpatient Hospital Stay (HOSPITAL_COMMUNITY)
Admission: AD | Admit: 2013-04-05 | Discharge: 2013-04-09 | DRG: 690 | Disposition: A | Discharge status: Home / Self Care | Payer: Medicaid Other | Source: Ambulatory Visit | Attending: Obstetrics & Gynecology | Admitting: Obstetrics & Gynecology

## 2013-04-05 ENCOUNTER — Encounter (HOSPITAL_COMMUNITY): Payer: Self-pay

## 2013-04-05 DIAGNOSIS — O212 Late vomiting of pregnancy: Secondary | ICD-10-CM

## 2013-04-05 DIAGNOSIS — O0930 Supervision of pregnancy with insufficient antenatal care, unspecified trimester: Secondary | ICD-10-CM

## 2013-04-05 DIAGNOSIS — N10 Acute tubulo-interstitial nephritis: Secondary | ICD-10-CM

## 2013-04-05 DIAGNOSIS — A498 Other bacterial infections of unspecified site: Secondary | ICD-10-CM

## 2013-04-05 LAB — URINALYSIS, ROUTINE W REFLEX MICROSCOPIC
Bilirubin Urine: NEGATIVE
Nitrite: POSITIVE — AB
Protein, ur: 100 mg/dL — AB
Specific Gravity, Urine: 1.025 (ref 1.005–1.030)
Urobilinogen, UA: 4 mg/dL — ABNORMAL HIGH (ref 0.0–1.0)

## 2013-04-05 LAB — CBC WITH DIFFERENTIAL/PLATELET
Basophils Absolute: 0 10*3/uL (ref 0.0–0.1)
Basophils Relative: 0 % (ref 0–1)
Eosinophils Absolute: 0 10*3/uL (ref 0.0–0.7)
Eosinophils Relative: 0 % (ref 0–5)
Lymphs Abs: 2.1 10*3/uL (ref 0.7–4.0)
MCH: 30.4 pg (ref 26.0–34.0)
MCHC: 33.8 g/dL (ref 30.0–36.0)
MCV: 89.9 fL (ref 78.0–100.0)
Neutrophils Relative %: 85 % — ABNORMAL HIGH (ref 43–77)
Platelets: 246 10*3/uL (ref 150–400)
RDW: 14.3 % (ref 11.5–15.5)

## 2013-04-05 MED ORDER — PROMETHAZINE HCL 25 MG/ML IJ SOLN
25.0000 mg | Freq: Once | INTRAVENOUS | Status: AC
  Administered 2013-04-06: 25 mg via INTRAVENOUS
  Filled 2013-04-05: qty 1

## 2013-04-05 MED ORDER — LACTATED RINGERS IV SOLN: INTRAVENOUS | Status: DC

## 2013-04-05 MED ORDER — HYDROMORPHONE HCL PF 1 MG/ML IJ SOLN
1.0000 mg | Freq: Once | INTRAMUSCULAR | Status: DC
  Administered 2013-04-06: 1 mg via INTRAVENOUS
  Filled 2013-04-05: qty 1

## 2013-04-05 MED ORDER — ONDANSETRON 4 MG PO TBDP
4.0000 mg | ORAL_TABLET | Freq: Three times a day (TID) | ORAL | Status: DC | PRN
  Filled 2013-04-05: qty 1

## 2013-04-05 MED ORDER — ZOLPIDEM TARTRATE 5 MG PO TABS: 5.0000 mg | ORAL_TABLET | Freq: Every evening | ORAL | Status: DC | PRN

## 2013-04-05 MED ORDER — CALCIUM CARBONATE ANTACID 500 MG PO CHEW: 2.0000 | CHEWABLE_TABLET | ORAL | Status: DC | PRN

## 2013-04-05 MED ORDER — ACETAMINOPHEN 325 MG PO TABS
650.0000 mg | ORAL_TABLET | ORAL | Status: DC | PRN
  Administered 2013-04-06: 650 mg via ORAL
  Filled 2013-04-05: qty 2

## 2013-04-05 MED ORDER — DOCUSATE SODIUM 100 MG PO CAPS
100.0000 mg | ORAL_CAPSULE | Freq: Every day | ORAL | Status: DC
  Administered 2013-04-06 – 2013-04-08 (×3): 100 mg via ORAL
  Filled 2013-04-05 (×3): qty 1

## 2013-04-05 MED ORDER — PRENATAL MULTIVITAMIN CH
1.0000 | ORAL_TABLET | Freq: Every day | ORAL | Status: DC
  Administered 2013-04-07 – 2013-04-08 (×2): 1 via ORAL
  Filled 2013-04-05 (×3): qty 1

## 2013-04-05 NOTE — H&P (Signed)
Kim Briggs is a 27 y.o. [phone] at [redacted]w[redacted]d who presents with Nausea/vomiting and flank/back pain.  Patient reports that she has had her normal pregnancy nausea/vomiting, but has had increasing right flank/back pain for the past 4 days.  Pain is sharp and severe (8/10 in severity).  She denies dysuria, vaginal bleeding/discharge, fevers, chills.  She reports increasing thirst and dark colored urine.    In the MAU, patient found to have WBC count of 23.5 (with left shift).  UA was significant for + nitrites, moderate leukocytes, moderate Hb, and many bacteria.   History OB History   Grav Para Term Preterm Abortions TAB SAB Ect Mult Living   5 3 3  0 1 0 1 0 0 3     Past Medical History  Diagnosis Date  . Depression   . Bipolar 1 disorder   . History of chicken pox   . H/O chlamydia infection 2000  . H/O candidiasis   . H/O cystitis   . Asthma     when younger  . Abnormal Pap smear     f/u was wnl   Past Surgical History  Procedure Laterality Date  . Adenoidectomy     Family History: family history includes Diabetes in her father, maternal grandmother, mother, and paternal aunt; Drug abuse in her mother; Heart disease in her mother; Hypertension in her father, maternal aunts, maternal grandmother, maternal uncle, mother, paternal aunt, and paternal grandmother; and Kidney disease in her maternal uncle. Social History:  reports that she has been smoking Cigarettes.  She has a 6.5 pack-year smoking history. She has never used smokeless tobacco. She reports that she does not drink alcohol or use illicit drugs.  ROS Per HPI   Blood pressure 118/64, pulse 109, temperature 99.5 F (37.5 C), temperature source Oral, resp. rate 16, height 5\' 6"  (1.676 m), weight 79.55 kg (175 lb 6 oz), last menstrual period 10/31/2012, SpO2 100.00%. Exam Physical Exam  Gen: resting comfortably in bed. NAD. Heart: RRR. No murmurs, rubs, or gallops. Lungs: CTAB. No rales, rhonchi, or wheezing. Abd:  gravid, mild-moderate tenderness in the RLQ/right flank Back: +CVA tenderness bilaterally. Ext: no appreciable lower extremity edema bilaterally Neuro: No focal deficits   Assessment/Plan: Kim Briggs is a 27 y.o. [id number] at [redacted]w[redacted]d who presents with acute pyelonephritis. - Will treat with IV fluids and  Rocephin and monitor for clinical improvement  - Zofran PRN nausea - IV Dilaudid Q2PRN for severe pain   Kim Briggs Other 04/05/2013, 11:00 PM   I was present for the exam and agree with above. Severe right CVAT.  Kim Briggs, CNM 04/06/2013 4:00 AM

## 2013-04-05 NOTE — MAU Note (Signed)
Pt states she has been vomiting x 4 days-states is related to the pregnancy-has developed lower side pain and low back pain that is constant-states has a light tinged red discharge on the tissue when she wipes

## 2013-04-06 LAB — RPR: RPR Ser Ql: NONREACTIVE

## 2013-04-06 LAB — TYPE AND SCREEN

## 2013-04-06 MED ORDER — DEXTROSE 5 % IV SOLN: 1.0000 g | INTRAVENOUS | Status: DC

## 2013-04-06 MED ORDER — HYDROMORPHONE HCL PF 1 MG/ML IJ SOLN
1.0000 mg | INTRAMUSCULAR | Status: DC | PRN
  Administered 2013-04-06 – 2013-04-08 (×7): 1 mg via INTRAVENOUS
  Filled 2013-04-06 (×7): qty 1

## 2013-04-06 MED ORDER — SODIUM CHLORIDE 0.9 % IV SOLN
INTRAVENOUS | Status: DC
  Administered 2013-04-06 – 2013-04-08 (×7): via INTRAVENOUS

## 2013-04-06 MED ORDER — DIPHENHYDRAMINE HCL 25 MG PO CAPS
50.0000 mg | ORAL_CAPSULE | Freq: Four times a day (QID) | ORAL | Status: DC | PRN
Start: 2013-04-06 — End: 2013-04-09
  Administered 2013-04-06: 50 mg via ORAL
  Filled 2013-04-06: qty 2

## 2013-04-06 MED ORDER — DEXTROSE 5 % IV SOLN
1.0000 g | Freq: Two times a day (BID) | INTRAVENOUS | Status: DC
  Administered 2013-04-06 – 2013-04-08 (×6): 1 g via INTRAVENOUS
  Filled 2013-04-06 (×7): qty 10

## 2013-04-06 NOTE — Progress Notes (Signed)
Patient ID: Kim Briggs, female   DOB: 05/09/1986, 27 y.o.   MRN: [id number]   HD #1  S. She is still feeling her back pain. She is now able to keep po down. She reports FM and denies VB or ROM  O. VSS, AF Tm 100.3 this am Heart-rrr Lungs- CTAB Abd- gravid, benign, FH 24 +CVAT bilaterally, right worse than left  A/P. Pyelo at 22+ weeks EGA. NPNC as of yet but she plans to go to [facility].

## 2013-04-06 NOTE — Progress Notes (Signed)
Ur chart review completed.  

## 2013-04-07 LAB — URINE CULTURE: Colony Count: >=100000

## 2013-04-07 NOTE — Progress Notes (Signed)
Patient ID: Kim Briggs, female   DOB: 01/23/1986, 27 y.o.   MRN: [id number] FACULTY PRACTICE ANTEPARTUM(COMPREHENSIVE) NOTE  Kim Briggs is a 27 y.o. [id number] with Estimated Date of Delivery: 08/07/13   By  sonogram [redacted]w[redacted]d   who is admitted for pyelonephritis.    Fetal presentation is unsure. Length of Stay:  2  Days  Date of admission:04/05/2013  Subjective: Patient states her blood in urine is gone but her pain is just about as bad. Patient reports the fetal movement as active. Patient reports uterine contraction  activity as none. Patient reports  vaginal bleeding as none. Patient describes fluid per vagina as None.  Vitals:  Blood pressure 101/69, pulse 94, temperature 98.5 F (36.9 C), temperature source Oral, resp. rate 18, height 5\' 6"  (1.676 m), weight 175 lb 9 oz (79.635 kg), last menstrual period 10/31/2012, SpO2 100.00%. Filed Vitals:   04/06/13 1811 04/06/13 1851 04/06/13 2020 04/06/13 2152  BP: 108/64   101/69  Pulse: 115   94  Temp: 99.4 F (37.4 C) 100.5 F (38.1 C) 99.3 F (37.4 C) 98.5 F (36.9 C)  TempSrc: Oral Oral Oral Oral  Resp: 16   18  Height:      Weight:    175 lb 9 oz (79.635 kg)  SpO2: 99%   100%   Physical Examination:  General appearance - alert, well appearing, and in no distress Back exam - full range of motion, no tenderness, palpable spasm or pain on motion, +CVAT on right severe   Labs:  No results found for this or any previous visit (from the past 24 hour(s)).  Imaging Studies:      Medications:  Scheduled . cefTRIAXone (ROCEPHIN)  IV  1 g Intravenous Q12H  . docusate sodium  100 mg Oral Daily  . prenatal multivitamin  1 tablet Oral Q1200   I have reviewed the patient's current medications.  ASSESSMENT:  Right Pyelonephritis   Patient Active Problem List   Diagnosis Date Noted  . Lactating mother 03/04/2012  . NSVD (normal spontaneous vaginal delivery) 03/03/2012  . Periurethral laceration, delivered, current  hospitalization 03/03/2012  . Prolonged rupture of membranes, greater than 24 hours, delivered, current hospitalization 03/03/2012  . PROM (premature rupture of membranes) 03/02/2012  . Post - term pregnancy 03/02/2012  . Smoker 02/23/2012  . Drug use 02/23/2012  . Abnormal Pap smear, low grade squamous intraepithelial lesion (LGSIL) 02/23/2012  . Bipolar 1 disorder 02/23/2012  . H/O Incarceration 08/26/2011    PLAN: Continue Rocephin 1 gram q12 hours, probably has 2 days at least of IV therapy  EURE,LUTHER H 04/07/2013,7:14 AM

## 2013-04-08 LAB — CBC
HCT: 31.1 % — ABNORMAL LOW (ref 36.0–46.0)
RBC: 3.48 MIL/uL — ABNORMAL LOW (ref 3.87–5.11)
RDW: 14.2 % (ref 11.5–15.5)
WBC: 11.9 10*3/uL — ABNORMAL HIGH (ref 4.0–10.5)

## 2013-04-08 MED ORDER — PRENATAL MULTIVITAMIN CH: 1.0000 | ORAL_TABLET | Freq: Every day | ORAL | Status: DC

## 2013-04-08 MED ORDER — CEPHALEXIN 500 MG PO CAPS: 500.0000 mg | ORAL_CAPSULE | Freq: Every day | ORAL | Status: DC

## 2013-04-08 MED ORDER — OXYCODONE-ACETAMINOPHEN 5-325 MG PO TABS
1.0000 | ORAL_TABLET | ORAL | Status: DC | PRN
  Administered 2013-04-08 (×3): 2 via ORAL
  Filled 2013-04-08 (×3): qty 2

## 2013-04-08 MED ORDER — CEPHALEXIN 500 MG PO CAPS: 500.0000 mg | ORAL_CAPSULE | Freq: Three times a day (TID) | ORAL | Status: DC

## 2013-04-08 MED ORDER — OXYCODONE-ACETAMINOPHEN 5-325 MG PO TABS: 1.0000 | ORAL_TABLET | ORAL | Status: DC | PRN

## 2013-04-08 MED ORDER — CEPHALEXIN 500 MG PO CAPS
500.0000 mg | ORAL_CAPSULE | Freq: Three times a day (TID) | ORAL | Status: DC
  Administered 2013-04-08 – 2013-04-09 (×2): 500 mg via ORAL
  Filled 2013-04-08 (×3): qty 1

## 2013-04-08 NOTE — Progress Notes (Signed)
Patient ID: Kim Briggs, female   DOB: [birth year], 27 y.o.   MRN: [id number]  FACULTY PRACTICE ANTEPARTUM COMPREHENSIVE PROGRESS NOTE  Kim Briggs is a 27 y.o. [id number] at [redacted]w[redacted]d  who is admitted for Pyelonephritis.  Estimated Date of Delivery: 08/07/13 Fetal presentation is unsure.  Length of Stay:  3 Days. 04/05/2013  Subjective: Reports continued R back/flank pain requiring pain medication, worse after getting out of bed. Voiding well. No subjective fever/chills.  No nausea/vomiting, tolerating PO. Patient reports good fetal movement.  She reports no uterine contractions, no bleeding and no loss of fluid per vagina.  Vitals:  Blood pressure 111/64, pulse 85, temperature 98.2 F (36.8 C), temperature source Oral, resp. rate 18, height 5\' 6"  (1.676 m), weight 80.003 kg (176 lb 6 oz), last menstrual period 10/31/2012, SpO2 95.00%.  Physical Examination: GEN:  WNWD, no distress HEENT:  NCAT, EOMI, conjunctiva clear NECK:  Supple, non-tender, no thyromegaly, trachea midline CV: RRR, no murmur RESP:  CTAB ABD:  Soft, normal bowel sounds, very tender R CVA/flank EXTREM:  Warm, well perfused, no edema or tenderness NEURO:  Alert, oriented, no focal deficits GU:  Deferred   Fetal Monitoring:  146 bpm by doppler  Labs:  Results for orders placed during the hospital encounter of 04/05/13 (from the past 24 hour(s))  CBC   Collection Time    04/08/13  5:45 AM      Result Value Range   WBC 11.9 (*) 4.0 - 10.5 K/uL   RBC 3.48 (*) 3.87 - 5.11 MIL/uL   Hemoglobin 10.6 (*) 12.0 - 15.0 g/dL   HCT 62.1 (*) 30.8 - 65.7 %   MCV 89.4  78.0 - 100.0 fL   MCH 30.5  26.0 - 34.0 pg   MCHC 34.1  30.0 - 36.0 g/dL   RDW 84.6  96.2 - 95.2 %   Platelets 246  150 - 400 K/uL    Imaging Studies:    none  Medications:  Scheduled . cefTRIAXone (ROCEPHIN)  IV  1 g Intravenous Q12H  . docusate sodium  100 mg Oral Daily  . prenatal multivitamin  1 tablet Oral Q1200   I have reviewed the  patient's current medications.  ASSESSMENT: 27 y.o. [id number] at [redacted]w[redacted]d admitted for Pyelonephritis - E coli, pan-sensitive  PLAN: On rocephin x 5 doses, last fever 100.5 at 18:51 on 04/06/13. Will be 48 hours tonight. Transition to PO antibiotics and hopefully d/c home tomorrow. Still very tender on R flank. FHTs present  Napoleon Form, MD 04/08/2013 8:07 AM

## 2013-04-09 NOTE — Discharge Summary (Signed)
Antenatal Physician Discharge Summary  Patient ID: Kim Briggs MRN: [id number] DOB/AGE: [birth year] 29 y.o.  Admit date: 04/05/2013 Discharge date: 04/09/2013  Admission Diagnoses:pregnancy  22 weeks, Acute Pyelonephritis  Discharge Diagnoses: Pregnancy 22 weeks, Acute Pyelonephritis E coli, S all antibiotics   Prenatal Procedures: none     Significant Diagnostic Studies:  Results for orders placed during the hospital encounter of 04/05/13 (from the past 168 hour(s))  URINE CULTURE   Collection Time    04/05/13  9:10 PM      Result Value Range   Specimen Description URINE, CLEAN CATCH     Special Requests NONE     Culture  Setup Time 04/06/2013 03:05     Colony Count >=100,000 COLONIES/ML     Culture ESCHERICHIA COLI     Report Status 04/07/2013 FINAL     Organism ID, Bacteria ESCHERICHIA COLI    URINALYSIS, ROUTINE W REFLEX MICROSCOPIC   Collection Time    04/05/13  9:10 PM      Result Value Range   Color, Urine YELLOW  YELLOW   APPearance TURBID (*) CLEAR   Specific Gravity, Urine 1.025  1.005 - 1.030   pH 6.0  5.0 - 8.0   Glucose, UA NEGATIVE  NEGATIVE mg/dL   Hgb urine dipstick MODERATE (*) NEGATIVE   Bilirubin Urine NEGATIVE  NEGATIVE   Ketones, ur >80 (*) NEGATIVE mg/dL   Protein, ur 409 (*) NEGATIVE mg/dL   Urobilinogen, UA 4.0 (*) 0.0 - 1.0 mg/dL   Nitrite POSITIVE (*) NEGATIVE   Leukocytes, UA MODERATE (*) NEGATIVE  URINE MICROSCOPIC-ADD ON   Collection Time    04/05/13  9:10 PM      Result Value Range   Squamous Epithelial / LPF RARE  RARE   WBC, UA 11-20  <3 WBC/hpf   RBC / HPF 3-6  <3 RBC/hpf   Bacteria, UA MANY (*) RARE  CBC WITH DIFFERENTIAL   Collection Time    04/05/13 10:35 PM      Result Value Range   WBC 23.5 (*) 4.0 - 10.5 K/uL   RBC 3.68 (*) 3.87 - 5.11 MIL/uL   Hemoglobin 11.2 (*) 12.0 - 15.0 g/dL   HCT 81.1 (*) 91.4 - 78.2 %   MCV 89.9  78.0 - 100.0 fL   MCH 30.4  26.0 - 34.0 pg   MCHC 33.8  30.0 - 36.0 g/dL   RDW 95.6  21.3 -  08.6 %   Platelets 246  150 - 400 K/uL   Neutrophils Relative % 85 (*) 43 - 77 %   Neutro Abs 19.9 (*) 1.7 - 7.7 K/uL   Lymphocytes Relative 9 (*) 12 - 46 %   Lymphs Abs 2.1  0.7 - 4.0 K/uL   Monocytes Relative 7  3 - 12 %   Monocytes Absolute 1.6 (*) 0.1 - 1.0 K/uL   Eosinophils Relative 0  0 - 5 %   Eosinophils Absolute 0.0  0.0 - 0.7 K/uL   Basophils Relative 0  0 - 1 %   Basophils Absolute 0.0  0.0 - 0.1 K/uL  HEPATITIS B SURFACE ANTIGEN   Collection Time    04/05/13 10:35 PM      Result Value Range   Hepatitis B Surface Ag NEGATIVE  NEGATIVE  RUBELLA SCREEN   Collection Time    04/05/13 10:35 PM      Result Value Range   Rubella 4.36 (*) <0.90 Index  RPR   Collection Time  04/05/13 10:35 PM      Result Value Range   RPR NON REACTIVE  NON REACTIVE  TYPE AND SCREEN   Collection Time    04/06/13  1:20 AM      Result Value Range   ABO/RH(D) A POS     Antibody Screen NEG     Sample Expiration 04/09/2013    CBC   Collection Time    04/08/13  5:45 AM      Result Value Range   WBC 11.9 (*) 4.0 - 10.5 K/uL   RBC 3.48 (*) 3.87 - 5.11 MIL/uL   Hemoglobin 10.6 (*) 12.0 - 15.0 g/dL   HCT 96.0 (*) 45.4 - 09.8 %   MCV 89.4  78.0 - 100.0 fL   MCH 30.5  26.0 - 34.0 pg   MCHC 34.1  30.0 - 36.0 g/dL   RDW 11.9  14.7 - 82.9 %   Platelets 246  150 - 400 K/uL    Treatments: IV hydration and antibiotics: ceftriaxone  Hospital Course:  This is a 27 y.o. F6O1308 with IUP at [redacted]w[redacted]d admitted for pyelonephritis. She had prompt defervescence, but had slow recovery from Rt CVAT, and then had no transportation when d/c attempted late 5/30. Discharged home on 5/31 on Keflex 500  Qid x 14 days to be followed with Macrodantin 100 hs for duration of pregancy.  Discharge Exam: BP 113/59  Pulse 82  Temp(Src) 97.7 F (36.5 C) (Oral)  Resp 18  Ht 5\' 6"  (1.676 m)  Wt 79.493 kg (175 lb 4 oz)  BMI 28.3 kg/m2  SpO2 100%  LMP 10/31/2012 Back: mild sensitivity to skin touch not true cvat.   GI: soft, non-tender; bowel sounds normal; no masses,  no organomegaly  Discharge Condition: good  Disposition: 01-Home or Self Care  Discharge Orders   Future Orders Complete By Expires     Discharge patient  As directed     Comments:      To home in AM 04/09/13 after seen by physician.        Medication List    TAKE these medications       cephALEXin 500 MG capsule  Commonly known as:  KEFLEX  Take 1 capsule (500 mg total) by mouth every 8 (eight) hours.     cephALEXin 500 MG capsule  Commonly known as:  KEFLEX  Take 1 capsule (500 mg total) by mouth at bedtime.     oxyCODONE-acetaminophen 5-325 MG per tablet  Commonly known as:  PERCOCET/ROXICET  Take 1-2 tablets by mouth every 4 (four) hours as needed.     prenatal multivitamin Tabs  Take 1 tablet by mouth daily at 12 noon.           Follow-up Information   Follow up with [facility] On 04/14/2013. (As scheduled.)    Contact information:   [address] [place] [address] [phone]      Follow up with [facility]. (Call or return to hospital  as needed if symptoms worsen)    Contact information:   [address] [place] [address] [phone]      Signed: Tilda Burrow M.D. 04/09/2013, 7:49 AM

## 2013-04-09 NOTE — Progress Notes (Signed)
Discharge instructions reviewed with patient.  Patient states understanding of home, medications, increasing po fluids, signs/symptoms to report to MD, activity and return MD office visit.  No home equipment needed.  Patient ambulated for discharge in stable condition with staff without incident.

## 2013-04-14 DIAGNOSIS — N10 Acute tubulo-interstitial nephritis: Secondary | ICD-10-CM | POA: Diagnosis not present

## 2013-04-14 DIAGNOSIS — Z87898 Personal history of other specified conditions: Secondary | ICD-10-CM

## 2013-04-23 ENCOUNTER — Inpatient Hospital Stay (HOSPITAL_COMMUNITY)
Admission: AD | Admit: 2013-04-23 | Discharge: 2013-04-23 | Disposition: A | Discharge status: Home / Self Care | Payer: Medicaid Other | Source: Ambulatory Visit | Attending: Obstetrics and Gynecology | Admitting: Obstetrics and Gynecology

## 2013-04-23 DIAGNOSIS — O99891 Other specified diseases and conditions complicating pregnancy: Secondary | ICD-10-CM | POA: Insufficient documentation

## 2013-04-23 DIAGNOSIS — Y92009 Unspecified place in unspecified non-institutional (private) residence as the place of occurrence of the external cause: Secondary | ICD-10-CM | POA: Insufficient documentation

## 2013-04-23 DIAGNOSIS — T148 Other injury of unspecified body region: Secondary | ICD-10-CM | POA: Insufficient documentation

## 2013-04-23 DIAGNOSIS — O0930 Supervision of pregnancy with insufficient antenatal care, unspecified trimester: Secondary | ICD-10-CM | POA: Insufficient documentation

## 2013-04-23 DIAGNOSIS — W57XXXA Bitten or stung by nonvenomous insect and other nonvenomous arthropods, initial encounter: Secondary | ICD-10-CM | POA: Insufficient documentation

## 2013-04-23 MED ORDER — HYDROCORTISONE 1 % EX CREA
TOPICAL_CREAM | Freq: Three times a day (TID) | CUTANEOUS | Status: DC
  Administered 2013-04-23: 15:00:00 via TOPICAL
  Filled 2013-04-23: qty 28

## 2013-04-23 MED ORDER — IBUPROFEN 600 MG PO TABS
600.0000 mg | ORAL_TABLET | Freq: Four times a day (QID) | ORAL | Status: DC | PRN
  Administered 2013-04-23: 600 mg via ORAL
  Filled 2013-04-23: qty 1

## 2013-04-23 MED ORDER — IBUPROFEN 600 MG PO TABS: 600.0000 mg | ORAL_TABLET | Freq: Four times a day (QID) | ORAL | Status: DC | PRN

## 2013-04-23 MED ORDER — CETIRIZINE HCL 10 MG PO TABS: 10.0000 mg | ORAL_TABLET | Freq: Every day | ORAL | Status: DC

## 2013-04-23 MED ORDER — DIPHENHYDRAMINE HCL 25 MG PO CAPS
50.0000 mg | ORAL_CAPSULE | Freq: Once | ORAL | Status: AC
  Administered 2013-04-23: 50 mg via ORAL
  Filled 2013-04-23: qty 2

## 2013-04-23 NOTE — MAU Note (Signed)
Pt presents with complaints of bug bites on her back, right arm and left eye. Left eye swollen
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
## 2013-05-03 ENCOUNTER — Encounter (HOSPITAL_COMMUNITY): Payer: Self-pay | Admitting: *Deleted

## 2013-05-03 ENCOUNTER — Observation Stay (HOSPITAL_COMMUNITY)
Admission: AD | Admit: 2013-05-03 | Discharge: 2013-05-04 | Disposition: A | Discharge status: Home / Self Care | Payer: Medicaid Other | Source: Ambulatory Visit | Attending: Obstetrics and Gynecology | Admitting: Obstetrics and Gynecology

## 2013-05-03 DIAGNOSIS — O26859 Spotting complicating pregnancy, unspecified trimester: Secondary | ICD-10-CM | POA: Insufficient documentation

## 2013-05-03 DIAGNOSIS — O99891 Other specified diseases and conditions complicating pregnancy: Principal | ICD-10-CM | POA: Insufficient documentation

## 2013-05-03 DIAGNOSIS — Z5900 Homelessness unspecified: Secondary | ICD-10-CM | POA: Insufficient documentation

## 2013-05-03 DIAGNOSIS — T7411XA Adult physical abuse, confirmed, initial encounter: Secondary | ICD-10-CM | POA: Insufficient documentation

## 2013-05-03 DIAGNOSIS — O26852 Spotting complicating pregnancy, second trimester: Secondary | ICD-10-CM | POA: Diagnosis not present

## 2013-05-03 DIAGNOSIS — Y92009 Unspecified place in unspecified non-institutional (private) residence as the place of occurrence of the external cause: Secondary | ICD-10-CM | POA: Insufficient documentation

## 2013-05-03 DIAGNOSIS — E860 Dehydration: Secondary | ICD-10-CM | POA: Diagnosis present

## 2013-05-03 DIAGNOSIS — R109 Unspecified abdominal pain: Secondary | ICD-10-CM | POA: Insufficient documentation

## 2013-05-03 DIAGNOSIS — F1910 Other psychoactive substance abuse, uncomplicated: Secondary | ICD-10-CM | POA: Diagnosis present

## 2013-05-03 DIAGNOSIS — A5901 Trichomonal vulvovaginitis: Secondary | ICD-10-CM | POA: Insufficient documentation

## 2013-05-03 DIAGNOSIS — A599 Trichomoniasis, unspecified: Secondary | ICD-10-CM | POA: Diagnosis present

## 2013-05-03 DIAGNOSIS — O0930 Supervision of pregnancy with insufficient antenatal care, unspecified trimester: Secondary | ICD-10-CM | POA: Insufficient documentation

## 2013-05-03 DIAGNOSIS — M549 Dorsalgia, unspecified: Secondary | ICD-10-CM | POA: Insufficient documentation

## 2013-05-03 DIAGNOSIS — IMO0002 Reserved for concepts with insufficient information to code with codable children: Secondary | ICD-10-CM | POA: Clinically undetermined

## 2013-05-03 DIAGNOSIS — O98819 Other maternal infectious and parasitic diseases complicating pregnancy, unspecified trimester: Secondary | ICD-10-CM | POA: Insufficient documentation

## 2013-05-03 HISTORY — DX: Trichomoniasis, unspecified: A59.9

## 2013-05-03 LAB — URINE MICROSCOPIC-ADD ON

## 2013-05-03 LAB — URINALYSIS, ROUTINE W REFLEX MICROSCOPIC
Glucose, UA: NEGATIVE mg/dL
Nitrite: NEGATIVE
Specific Gravity, Urine: >1.03 — ABNORMAL HIGH (ref 1.005–1.030)
pH: 6 (ref 5.0–8.0)

## 2013-05-03 MED ORDER — PRENATAL MULTIVITAMIN CH
1.0000 | ORAL_TABLET | Freq: Every day | ORAL | Status: DC
  Administered 2013-05-04: 1 via ORAL
  Filled 2013-05-03: qty 1

## 2013-05-03 MED ORDER — LACTATED RINGERS IV SOLN
INTRAVENOUS | Status: DC
  Administered 2013-05-04: 07:00:00 via INTRAVENOUS

## 2013-05-03 MED ORDER — IBUPROFEN 600 MG PO TABS
600.0000 mg | ORAL_TABLET | Freq: Four times a day (QID) | ORAL | Status: DC | PRN
  Administered 2013-05-04: 600 mg via ORAL
  Filled 2013-05-03: qty 1

## 2013-05-03 MED ORDER — DOCUSATE SODIUM 100 MG PO CAPS
100.0000 mg | ORAL_CAPSULE | Freq: Every day | ORAL | Status: DC
Start: 2013-05-04 — End: 2013-05-04

## 2013-05-03 MED ORDER — ZOLPIDEM TARTRATE 5 MG PO TABS: 5.0000 mg | ORAL_TABLET | Freq: Every evening | ORAL | Status: DC | PRN

## 2013-05-03 MED ORDER — ACETAMINOPHEN 325 MG PO TABS: 650.0000 mg | ORAL_TABLET | ORAL | Status: DC | PRN

## 2013-05-03 MED ORDER — LACTATED RINGERS IV BOLUS (SEPSIS)
1000.0000 mL | Freq: Once | INTRAVENOUS | Status: AC
  Administered 2013-05-04: 1000 mL via INTRAVENOUS

## 2013-05-03 MED ORDER — CALCIUM CARBONATE ANTACID 500 MG PO CHEW: 2.0000 | CHEWABLE_TABLET | ORAL | Status: DC | PRN

## 2013-05-03 NOTE — MAU Note (Signed)
H. Steelman CNM at the bedside. 

## 2013-05-03 NOTE — MAU Note (Signed)
Pt returned from bathroom, monitors reapplied. 

## 2013-05-03 NOTE — MAU Note (Signed)
Eustace Pen CNM on unit, notified of pt.
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
## 2013-05-04 ENCOUNTER — Encounter (HOSPITAL_COMMUNITY): Payer: Self-pay | Admitting: *Deleted

## 2013-05-04 DIAGNOSIS — O26852 Spotting complicating pregnancy, second trimester: Secondary | ICD-10-CM | POA: Diagnosis not present

## 2013-05-04 DIAGNOSIS — E860 Dehydration: Secondary | ICD-10-CM | POA: Diagnosis present

## 2013-05-04 DIAGNOSIS — IMO0002 Reserved for concepts with insufficient information to code with codable children: Secondary | ICD-10-CM | POA: Clinically undetermined

## 2013-05-04 LAB — RAPID URINE DRUG SCREEN, HOSP PERFORMED
Amphetamines: NOT DETECTED
Benzodiazepines: NOT DETECTED
Cocaine: POSITIVE — AB
Opiates: NOT DETECTED
Tetrahydrocannabinol: POSITIVE — AB

## 2013-05-04 LAB — COMPREHENSIVE METABOLIC PANEL
AST: 13 U/L (ref 0–37)
CO2: 24 mEq/L (ref 19–32)
Calcium: 9.3 mg/dL (ref 8.4–10.5)
Creatinine, Ser: 0.55 mg/dL (ref 0.50–1.10)
GFR calc Af Amer: >90 mL/min (ref >90)
GFR calc non Af Amer: >90 mL/min (ref >90)
Glucose, Bld: 80 mg/dL (ref 70–99)

## 2013-05-04 LAB — GC/CHLAMYDIA PROBE AMP: GC Probe RNA: NEGATIVE

## 2013-05-04 LAB — CBC
Hemoglobin: 10.5 g/dL — ABNORMAL LOW (ref 12.0–15.0)
RBC: 3.5 MIL/uL — ABNORMAL LOW (ref 3.87–5.11)

## 2013-05-04 MED ORDER — METRONIDAZOLE 500 MG PO TABS
500.0000 mg | ORAL_TABLET | Freq: Two times a day (BID) | ORAL | Status: DC
  Filled 2013-05-04: qty 1

## 2013-05-04 MED ORDER — METRONIDAZOLE 500 MG PO TABS
2000.0000 mg | ORAL_TABLET | Freq: Once | ORAL | Status: AC
  Administered 2013-05-04: 2000 mg via ORAL
  Filled 2013-05-04: qty 4

## 2013-05-04 MED ORDER — METRONIDAZOLE 500 MG PO TABS
500.0000 mg | ORAL_TABLET | Freq: Two times a day (BID) | ORAL | Status: DC
  Administered 2013-05-04 (×2): 500 mg via ORAL
  Filled 2013-05-04 (×4): qty 1
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
## 2013-05-04 NOTE — Plan of Care (Signed)
Problem: Consults Goal: Birthing Suites Patient Information Press F2 to bring up selections list   Pt < [redacted] weeks EGA     
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
## 2013-05-04 NOTE — Progress Notes (Signed)
Discussed social situation with Lulu Riding, CSW. Patient was offered the only bed available in a rehab facility in [place] but declined. No other beds are available. CSW has given her contact number so patient can follow-up. Will d/c today since there is no medical indication for inpatient management.   A follow-up appointment will be made with Faculty practice. Patient is aware that will not continue her care at [facility].

## 2013-05-04 NOTE — Progress Notes (Signed)
CSW met with patient to inform her of every place that CSW has contacted today and what the results of these conversations were (full list of places contacted is in patient's initial assessment).  CSW explained that CSW is awaiting a call back from the [facility] program, but patient will have to call CSW regularly to follow up since patient does not have a contact number.  CSW informed patient that there is a bed available at [facility] in [place], but she would have to be willing to accept their services.  She yelled at CSW stating she is not willing to leave [place] because her children are here.  Meanwhile, patient's phone rang.  She told the caller that she would call them back.  CSW asked patient who has been calling her room and if they were supportive.  She would no longer make eye contact with CSW and did not answer this question.  CSW asked if she has anyone she can call for assistance and she said, "so am I discharged?"  CSW explained, again, that without a medical reason to be in the hospital, patient has to be discharge.  She firmly said "thank you."  CSW asked her to call CSW if she changes her mind, but there is nothing else we can offer her at this time.  CSW spoke with Dr. Estanislado Pandy to say that patient refuses the bed offered to her and that there are no other options that CSW is aware of.  CSW states patient cannot stay in the hospital if there is no medical reason.

## 2013-05-04 NOTE — Progress Notes (Signed)
CSW called Chaplain/Lisa to request that she provide support to patient if available.

## 2013-05-04 NOTE — Progress Notes (Signed)
05/04/13 1500  Clinical Encounter Type  Visited With Patient  Visit Type Initial  Referral From Social work;Nurse  Spiritual Encounters  Spiritual Needs Emotional   Made initial visit to introduce spiritual care, attempting to offer support per referral from CSW and RN, but Jemya, though tearful, preferred to be alone.  Gently let her know of ongoing chaplain availability.  Please page as needed:  [phone].  Thank you.  [address] [place], [place] [phone]

## 2013-05-05 LAB — URINE CULTURE: Colony Count: >=100000
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
## 2013-05-12 ENCOUNTER — Encounter: Payer: Self-pay | Admitting: Obstetrics & Gynecology

## 2013-05-12 ENCOUNTER — Ambulatory Visit (INDEPENDENT_AMBULATORY_CARE_PROVIDER_SITE_OTHER): Payer: Medicaid Other | Admitting: Obstetrics & Gynecology

## 2013-05-12 VITALS — BP 112/72 | Temp 98.2°F | Ht 64.0 in | Wt 186.5 lb

## 2013-05-12 DIAGNOSIS — O0970 Supervision of high risk pregnancy due to social problems, unspecified trimester: Secondary | ICD-10-CM | POA: Insufficient documentation

## 2013-05-12 DIAGNOSIS — O09899 Supervision of other high risk pregnancies, unspecified trimester: Secondary | ICD-10-CM

## 2013-05-12 DIAGNOSIS — O0973 Supervision of high risk pregnancy due to social problems, third trimester: Secondary | ICD-10-CM

## 2013-05-12 NOTE — Progress Notes (Signed)
Transfer from [facility] with social problems, homeless, substance use, bipolar

## 2013-05-12 NOTE — Progress Notes (Signed)
Nutrition note: 1st visit consult Pt has gained 21.5# @ [redacted]w[redacted]d, which is > expected so far. Pt reports eating 4 meals & 1 snack/d. Pt reports no N/V but has some heartburn. Pt is taking a PNV. Pt received verbal & written education on general nutrition during pregnancy. Discussed tips to decrease heartburn. Discussed wt gain goals of 15-25# or 0.6#/wk. Pt agrees to continue taking PNV. Pt does not have WIC but plans to apply. Pt plans to BF. F/u if referred Blondell Reveal, MS, RD, LDN

## 2013-05-12 NOTE — Progress Notes (Signed)
P=98, , here for initial ob visit. C/o little pressure in vaginal opening area, States can't stay for GTT today because daughter has appt today. Given new patient information today, discussed appropriate weight gain.

## 2013-05-12 NOTE — Patient Instructions (Signed)

## 2013-05-16 ENCOUNTER — Encounter: Payer: Self-pay | Admitting: Obstetrics & Gynecology

## 2013-05-26 ENCOUNTER — Ambulatory Visit (INDEPENDENT_AMBULATORY_CARE_PROVIDER_SITE_OTHER): Payer: Medicaid Other | Admitting: Family

## 2013-05-26 VITALS — BP 106/68 | Temp 98.0°F | Wt 191.2 lb

## 2013-05-26 DIAGNOSIS — O09899 Supervision of other high risk pregnancies, unspecified trimester: Secondary | ICD-10-CM

## 2013-05-26 DIAGNOSIS — O0973 Supervision of high risk pregnancy due to social problems, third trimester: Secondary | ICD-10-CM

## 2013-05-26 LAB — POCT URINALYSIS DIP (DEVICE)
Leukocytes, UA: NEGATIVE
Protein, ur: NEGATIVE mg/dL
Specific Gravity, Urine: 1.01 (ref 1.005–1.030)
Urobilinogen, UA: 0.2 mg/dL (ref 0.0–1.0)

## 2013-05-26 LAB — CBC
MCV: 90.7 fL (ref 78.0–100.0)
Platelets: 302 10*3/uL (ref 150–400)
RDW: 13.7 % (ref 11.5–15.5)
WBC: 15.2 10*3/uL — ABNORMAL HIGH (ref 4.0–10.5)

## 2013-05-26 NOTE — Progress Notes (Signed)
Pulse:  68 1hr gtt today due at 1000. Need to see SW

## 2013-05-26 NOTE — Progress Notes (Signed)
No questions or concerns; reports living in shelter and can stay for up to a year.  Reports completing ultrasound and quad screen at [facility] results not on chart. 1 hr obtained today.  To see Arline Asp to sign BTL paperwork.

## 2013-05-27 ENCOUNTER — Encounter: Payer: Self-pay | Admitting: Family

## 2013-05-27 LAB — GLUCOSE TOLERANCE, 1 HOUR (50G) W/O FASTING: Glucose, 1 Hour GTT: 128 mg/dL (ref 70–140)

## 2013-06-02 ENCOUNTER — Encounter: Payer: Self-pay | Admitting: *Deleted

## 2013-06-02 DIAGNOSIS — O0970 Supervision of high risk pregnancy due to social problems, unspecified trimester: Secondary | ICD-10-CM

## 2013-06-06 ENCOUNTER — Telehealth: Payer: Self-pay | Admitting: *Deleted

## 2013-06-06 NOTE — Telephone Encounter (Signed)
Patient left message stating she was a patient at [facility] and she is calling regarding results of a quad screen and other test. States she called [facility] and they told her to call clinic since she is getting care here now.

## 2013-06-06 NOTE — Telephone Encounter (Signed)
Pt called back and left # [phone].  Called the # and was unable to leave message due to phone kept ringing.

## 2013-06-06 NOTE — Telephone Encounter (Signed)
Called pt and went to straight to VM left message to return call to the clinics.

## 2013-06-09 ENCOUNTER — Encounter: Payer: Medicaid Other | Admitting: Advanced Practice Midwife

## 2013-06-09 NOTE — Telephone Encounter (Signed)
Called patient and stated I was returning her phone call. Patient stated she had already contacted [facility] and got her results. Patient had no further questions

## 2013-06-16 ENCOUNTER — Encounter: Payer: Medicaid Other | Admitting: Obstetrics & Gynecology

## 2013-06-21 ENCOUNTER — Encounter: Payer: Self-pay | Admitting: *Deleted

## 2013-07-15 ENCOUNTER — Observation Stay: Payer: Self-pay | Admitting: Obstetrics and Gynecology

## 2013-07-15 LAB — DRUG SCREEN, URINE
Amphetamines, Ur Screen: NEGATIVE (ref ?–1000)
Benzodiazepine, Ur Scrn: NEGATIVE (ref ?–200)
Cannabinoid 50 Ng, Ur ~~LOC~~: NEGATIVE (ref ?–50)
Methadone, Ur Screen: NEGATIVE (ref ?–300)

## 2013-07-30 ENCOUNTER — Inpatient Hospital Stay: Payer: Self-pay

## 2013-07-30 LAB — URINALYSIS, COMPLETE
Ketone: NEGATIVE
Leukocyte Esterase: NEGATIVE
Ph: 6 (ref 4.5–8.0)
RBC,UR: <1 /HPF (ref 0–5)
Specific Gravity: 1.013 (ref 1.003–1.030)
Squamous Epithelial: 2

## 2013-07-30 LAB — DRUG SCREEN, URINE
Barbiturates, Ur Screen: NEGATIVE (ref ?–200)
Benzodiazepine, Ur Scrn: NEGATIVE (ref ?–200)
Cannabinoid 50 Ng, Ur ~~LOC~~: NEGATIVE (ref ?–50)
Cocaine Metabolite,Ur ~~LOC~~: NEGATIVE (ref ?–300)
Phencyclidine (PCP) Ur S: NEGATIVE (ref ?–25)

## 2013-07-31 LAB — COMPREHENSIVE METABOLIC PANEL
Albumin: 2.6 g/dL — ABNORMAL LOW (ref 3.4–5.0)
Alkaline Phosphatase: 186 U/L — ABNORMAL HIGH (ref 50–136)
BUN: 7 mg/dL (ref 7–18)
Calcium, Total: 9 mg/dL (ref 8.5–10.1)
Chloride: 107 mmol/L (ref 98–107)
Co2: 23 mmol/L (ref 21–32)
Creatinine: 0.62 mg/dL (ref 0.60–1.30)
EGFR (African American): >60
Glucose: 122 mg/dL — ABNORMAL HIGH (ref 65–99)
Potassium: 3.8 mmol/L (ref 3.5–5.1)
SGPT (ALT): 13 U/L (ref 12–78)
Sodium: 137 mmol/L (ref 136–145)

## 2013-07-31 LAB — CBC WITH DIFFERENTIAL/PLATELET
Basophil #: 0 10*3/uL (ref 0.0–0.1)
Basophil %: 0.2 %
Eosinophil %: 1.1 %
MCH: 29.6 pg (ref 26.0–34.0)
Monocyte #: 0.9 x10 3/mm (ref 0.2–0.9)
Monocyte %: 5.8 %
Neutrophil #: 11.5 10*3/uL — ABNORMAL HIGH (ref 1.4–6.5)
Neutrophil %: 70.3 %
Platelet: 278 10*3/uL (ref 150–440)
RBC: 3.4 10*6/uL — ABNORMAL LOW (ref 3.80–5.20)
RDW: 15.2 % — ABNORMAL HIGH (ref 11.5–14.5)

## 2013-08-01 LAB — HEMATOCRIT: HCT: 26.9 % — ABNORMAL LOW (ref 35.0–47.0)

## 2013-10-23 ENCOUNTER — Encounter (HOSPITAL_COMMUNITY): Payer: Self-pay | Admitting: *Deleted

## 2013-12-09 ENCOUNTER — Encounter (HOSPITAL_COMMUNITY): Payer: Self-pay | Admitting: Emergency Medicine

## 2013-12-09 ENCOUNTER — Emergency Department (HOSPITAL_COMMUNITY)
Admission: EM | Admit: 2013-12-09 | Discharge: 2013-12-09 | Disposition: A | Discharge status: Home / Self Care | Payer: Medicaid Other | Attending: Emergency Medicine | Admitting: Emergency Medicine

## 2013-12-09 DIAGNOSIS — F17200 Nicotine dependence, unspecified, uncomplicated: Secondary | ICD-10-CM | POA: Insufficient documentation

## 2013-12-09 DIAGNOSIS — J45909 Unspecified asthma, uncomplicated: Secondary | ICD-10-CM | POA: Insufficient documentation

## 2013-12-09 DIAGNOSIS — Z8659 Personal history of other mental and behavioral disorders: Secondary | ICD-10-CM | POA: Insufficient documentation

## 2013-12-09 DIAGNOSIS — Z8619 Personal history of other infectious and parasitic diseases: Secondary | ICD-10-CM | POA: Insufficient documentation

## 2013-12-09 DIAGNOSIS — J069 Acute upper respiratory infection, unspecified: Secondary | ICD-10-CM | POA: Insufficient documentation

## 2013-12-09 DIAGNOSIS — Z87448 Personal history of other diseases of urinary system: Secondary | ICD-10-CM | POA: Insufficient documentation

## 2013-12-09 MED ORDER — PROMETHAZINE-DM 6.25-15 MG/5ML PO SYRP: 2.5000 mL | ORAL_SOLUTION | Freq: Four times a day (QID) | ORAL | Status: AC | PRN

## 2013-12-09 NOTE — ED Notes (Signed)
Pt states she is not pregnant and does not wish to give a pregnancy test.

## 2013-12-09 NOTE — ED Notes (Signed)
PA at bedside.

## 2013-12-09 NOTE — ED Notes (Signed)
For the past 3-4 days the pt has had a cold with a cough sinus congestion  And a sorethroat.  lmpdec 21.  She had a c-section sept 21st

## 2013-12-09 NOTE — Discharge Instructions (Signed)
Cough, Adult  A cough is a reflex that helps clear your throat and airways. It can help heal the body or may be a reaction to an irritated airway. A cough may only last 2 or 3 weeks (acute) or may last more than 8 weeks (chronic).  CAUSES Acute cough:  Viral or bacterial infections. Chronic cough:  Infections.  Allergies.  Asthma.  Post-nasal drip.  Smoking.  Heartburn or acid reflux.  Some medicines.  Chronic lung problems (COPD).  Cancer. SYMPTOMS   Cough.  Fever.  Chest pain.  Increased breathing rate.  High-pitched whistling sound when breathing (wheezing).  Colored mucus that you cough up (sputum). TREATMENT   A bacterial cough may be treated with antibiotic medicine.  A viral cough must run its course and will not respond to antibiotics.  Your caregiver may recommend other treatments if you have a chronic cough. HOME CARE INSTRUCTIONS   Only take over-the-counter or prescription medicines for pain, discomfort, or fever as directed by your caregiver. Use cough suppressants only as directed by your caregiver.  Use a cold steam vaporizer or humidifier in your bedroom or home to help loosen secretions.  Sleep in a semi-upright position if your cough is worse at night.  Rest as needed.  Stop smoking if you smoke. SEEK IMMEDIATE MEDICAL CARE IF:   You have pus in your sputum.  Your cough starts to worsen.  You cannot control your cough with suppressants and are losing sleep.  You begin coughing up blood.  You have difficulty breathing.  You develop pain which is getting worse or is uncontrolled with medicine.  You have a fever. MAKE SURE YOU:   Understand these instructions.  Will watch your condition.  Will get help right away if you are not doing well or get worse. Document Released: 04/25/2011 Document Revised: 01/19/2012 Document Reviewed: 04/25/2011 ExitCare Patient Information 2014 [employer].  Upper Respiratory Infection,  Adult An upper respiratory infection (URI) is also sometimes known as the common cold. The upper respiratory tract includes the nose, sinuses, throat, trachea, and bronchi. Bronchi are the airways leading to the lungs. Most people improve within 1 week, but symptoms can last up to 2 weeks. A residual cough may last even longer.  CAUSES Many different viruses can infect the tissues lining the upper respiratory tract. The tissues become irritated and inflamed and often become very moist. Mucus production is also common. A cold is contagious. You can easily spread the virus to others by oral contact. This includes kissing, sharing a glass, coughing, or sneezing. Touching your mouth or nose and then touching a surface, which is then touched by another person, can also spread the virus. SYMPTOMS  Symptoms typically develop 1 to 3 days after you come in contact with a cold virus. Symptoms vary from person to person. They may include:  Runny nose.  Sneezing.  Nasal congestion.  Sinus irritation.  Sore throat.  Loss of voice (laryngitis).  Cough.  Fatigue.  Muscle aches.  Loss of appetite.  Headache.  Low-grade fever. DIAGNOSIS  You might diagnose your own cold based on familiar symptoms, since most people get a cold 2 to 3 times a year. Your caregiver can confirm this based on your exam. Most importantly, your caregiver can check that your symptoms are not due to another disease such as strep throat, sinusitis, pneumonia, asthma, or epiglottitis. Blood tests, throat tests, and X-rays are not necessary to diagnose a common cold, but they may sometimes be helpful   in excluding other more serious diseases. Your caregiver will decide if any further tests are required. RISKS AND COMPLICATIONS  You may be at risk for a more severe case of the common cold if you smoke cigarettes, have chronic heart disease (such as heart failure) or lung disease (such as asthma), or if you have a weakened immune  system. The very young and very old are also at risk for more serious infections. Bacterial sinusitis, middle ear infections, and bacterial pneumonia can complicate the common cold. The common cold can worsen asthma and chronic obstructive pulmonary disease (COPD). Sometimes, these complications can require emergency medical care and may be life-threatening. PREVENTION  The best way to protect against getting a cold is to practice good hygiene. Avoid oral or hand contact with people with cold symptoms. Wash your hands often if contact occurs. There is no clear evidence that vitamin C, vitamin E, echinacea, or exercise reduces the chance of developing a cold. However, it is always recommended to get plenty of rest and practice good nutrition. TREATMENT  Treatment is directed at relieving symptoms. There is no cure. Antibiotics are not effective, because the infection is caused by a virus, not by bacteria. Treatment may include:  Increased fluid intake. Sports drinks offer valuable electrolytes, sugars, and fluids.  Breathing heated mist or steam (vaporizer or shower).  Eating chicken soup or other clear broths, and maintaining good nutrition.  Getting plenty of rest.  Using gargles or lozenges for comfort.  Controlling fevers with ibuprofen or acetaminophen as directed by your caregiver.  Increasing usage of your inhaler if you have asthma. Zinc gel and zinc lozenges, taken in the first 24 hours of the common cold, can shorten the duration and lessen the severity of symptoms. Pain medicines may help with fever, muscle aches, and throat pain. A variety of non-prescription medicines are available to treat congestion and runny nose. Your caregiver can make recommendations and may suggest nasal or lung inhalers for other symptoms.  HOME CARE INSTRUCTIONS   Only take over-the-counter or prescription medicines for pain, discomfort, or fever as directed by your caregiver.  Use a warm mist humidifier  or inhale steam from a shower to increase air moisture. This may keep secretions moist and make it easier to breathe.  Drink enough water and fluids to keep your urine clear or pale yellow.  Rest as needed.  Return to work when your temperature has returned to normal or as your caregiver advises. You may need to stay home longer to avoid infecting others. You can also use a face mask and careful hand washing to prevent spread of the virus. SEEK MEDICAL CARE IF:   After the first few days, you feel you are getting worse rather than better.  You need your caregiver's advice about medicines to control symptoms.  You develop chills, worsening shortness of breath, or brown or red sputum. These may be signs of pneumonia.  You develop yellow or brown nasal discharge or pain in the face, especially when you bend forward. These may be signs of sinusitis.  You develop a fever, swollen neck glands, pain with swallowing, or white areas in the back of your throat. These may be signs of strep throat. SEEK IMMEDIATE MEDICAL CARE IF:   You have a fever.  You develop severe or persistent headache, ear pain, sinus pain, or chest pain.  You develop wheezing, a prolonged cough, cough up blood, or have a change in your usual mucus (if you have chronic   lung disease).  You develop sore muscles or a stiff neck. Document Released: 04/22/2001 Document Revised: 01/19/2012 Document Reviewed: 02/28/2011 ExitCare Patient Information 2014 [employer].  

## 2013-12-09 NOTE — ED Provider Notes (Signed)
CSN: [id number]     Arrival date & time 12/09/13  2116 History  This chart was scribed for non-physician practitioner Wylene SimmerFrancis Arnett Galindez, PA-C working with Juliet RudeNathan R. Rubin PayorPickering, MD by Joaquin MusicKristina Sanchez-Matthews, ED Scribe. This patient was seen in room TR09C/TR09C and the patient's care was started at  9:54 PM  Chief Complaint  Patient presents with  . Cough   The history is provided by the patient. No language interpreter was used.   HPI Comments: Kim Briggs is a 28 y.o. female who presents to the Emergency Department complaining of ongoing worsening dry, productive, harsh cough for the past 3 days. She states her throat "feels like sand paper" and reports having sputum. Pt states she has been having nasal congestion and states she has been needing to sleep with her mouth open due to nasal congestion. Pt denies taking OTC medications but reports doing salt water gargles and taking throat lozenges with some relief. Pt states she has a hx of tonsillitis that she reports having yearly since she was younger. Pt states she is a smoker. Pt denies fever, chills, loss of appetite, facial pain, and body aches.  Pt states she is currently breast feeding her 584 month old daughter. Pt states her LNMP was end of December. She denies possibility of pregnancy.  Past Medical History  Diagnosis Date  . Depression   . Bipolar 1 disorder   . History of chicken pox   . H/O chlamydia infection 2000  . H/O candidiasis   . H/O cystitis   . Asthma     when younger  . Abnormal Pap smear     f/u was wnl  . Trichimoniasis 05/03/2013    Rx'd Flagyl 500mg  po bid; in urine and on wet prep   Past Surgical History  Procedure Laterality Date  . Adenoidectomy     Family History  Problem Relation Age of Onset  . Hypertension Mother   . Diabetes Mother   . Heart disease Mother   . Drug abuse Mother   . Hypertension Father   . Diabetes Father   . Hypertension Maternal Aunt   . Hypertension Maternal Uncle   .  Kidney disease Maternal Uncle     dialysis  . Hypertension Paternal Aunt   . Diabetes Paternal Aunt   . Hypertension Maternal Grandmother   . Diabetes Maternal Grandmother   . Hypertension Paternal Grandmother   . Hypertension Maternal Aunt    History  Substance Use Topics  . Smoking status: Current Every Day Smoker -- 0.25 packs/day for 13 years    Types: Cigarettes  . Smokeless tobacco: Never Used  . Alcohol Use: No   OB History   Grav Para Term Preterm Abortions TAB SAB Ect Mult Living   5 3 3  0 1 0 1 0 0 3     Review of Systems  Respiratory: Positive for cough.     Allergies  Review of patient's allergies indicates no known allergies.  Home Medications  No current outpatient prescriptions on file.  BP 127/74  Pulse 76  Temp(Src) 97.9 F (36.6 C) (Oral)  Resp 18  Wt 188 lb 12.8 oz (85.639 kg)  SpO2 100%  LMP 10/31/2013  Physical Exam  Nursing note and vitals reviewed. Constitutional: She is oriented to person, place, and time. She appears well-developed and well-nourished. No distress.  HENT:  Head: Normocephalic and atraumatic.  Right Ear: External ear normal.  Mouth/Throat: Oropharynx is clear and moist. No oropharyngeal exudate.  Boggy nasal  mucosa with clear rhinorrhea, maxillary sinus tenderness to percussion  Eyes: Conjunctivae are normal. Pupils are equal, round, and reactive to light. No scleral icterus.  Neck: Normal range of motion. Neck supple.  Cardiovascular: Normal rate, regular rhythm and normal heart sounds.  Exam reveals no gallop and no friction rub.   No murmur heard. Pulmonary/Chest: Effort normal and breath sounds normal. No respiratory distress. She has no wheezes. She has no rales. She exhibits no tenderness.  Abdominal: Soft. Bowel sounds are normal. She exhibits no distension. There is no tenderness.  Musculoskeletal: Normal range of motion. She exhibits no edema and no tenderness.  Lymphadenopathy:    She has no cervical adenopathy.   Neurological: She is alert and oriented to person, place, and time. She exhibits normal muscle tone. Coordination normal.  Skin: Skin is warm and dry. No rash noted. No erythema. No pallor.  Psychiatric: She has a normal mood and affect. Her behavior is normal. Judgment and thought content normal.    ED Course  Procedures  DIAGNOSTIC STUDIES: Oxygen Saturation is 100% on RA, normal by my interpretation.    COORDINATION OF CARE: 9:59 PM-Discussed treatment plan which includes discharge pt with medications. Pt agreed to plan.   Labs Review Labs Reviewed - No data to display Imaging Review No results found.  EKG Interpretation   None       MDM  URI  Patient here with viral URI symptoms.  No indication of influenza, CAP.  I personally performed the services described in this documentation, which was scribed in my presence. The recorded information has been reviewed and is accurate.   Izola Price Marisue Humble, [place] 12/09/13 2211

## 2013-12-11 NOTE — ED Provider Notes (Signed)
Medical screening examination/treatment/procedure(s) were performed by non-physician practitioner and as supervising physician I was immediately available for consultation/collaboration.  EKG Interpretation   None        Juliet RudeNathan R. Rubin PayorPickering, MD 12/11/13 [phone]

## 2014-01-08 ENCOUNTER — Emergency Department: Payer: Self-pay | Admitting: Emergency Medicine

## 2014-09-11 ENCOUNTER — Encounter (HOSPITAL_COMMUNITY): Payer: Self-pay | Admitting: Emergency Medicine

## 2015-03-02 NOTE — Op Note (Signed)
PATIENT NAME:  Kim Briggs, Kim Briggs MR#:  [id number] DATE OF BIRTH:  Nov 12, 1985  DATE OF PROCEDURE:  07/31/2013  PREOPERATIVE DIAGNOSIS: Fetal intolerance to labor, family refusing Cesarean section, secondary arrest of dilation with dysfunctional labor.  POSTOPERATIVE DIAGNOSIS: Fetal intolerance to labor, family refusing Cesarean section, secondary arrest of dilation with dysfunctional labor.  PROCEDURE:  Primary lower uterine transverse Cesarean section.   SURGEON: Adria Devonarrie Jamisen Duerson, MD  ESTIMATED BLOOD LOSS: 1000 mL.   FINDINGS: Term liveborn infant with normal uterus, tubes and ovaries.  DESCRIPTION OF PROCEDURE: The patient was taken to the operating room and placed in the supine position. After adequate spinal anesthesia was instilled, the patient was prepped and draped in the usual sterile fashion. Pfannenstiel skin incision was dropped at approximately 2 fingerbreadths above the pubic symphysis and carried sharply down to the fascia. The fascia was nicked in the midline and the incision was extended in superolateral manner. The muscle bellies were sharply and bluntly dissected off the rectus fascia. Muscle bellies were split in the midline and the peritoneum was grasped. Peritoneum was cut and entered. Bladder blade was placed. Bladder flap was created. The incision was made. The infant's head was delivered. Cord was clamped and cut. The infant was handed to the awaiting pediatrician. Pitocin was started. Placenta delivered spontaneously. The uterus was delivered and wrapped in a moist laparotomy sponge. The anterior of the uterus was curetted with a moist lap sponge. The uterine incision was closed with a running locked chromic suture and a running imbricating suture. The bladder flap was tacked back up to the uterine incision and the abdomen was cleared of clots. The gutters were cleared of clots, the uterus was placed back into the abdomen and the muscle bellies were approximated with a running  Vicryl suture. (Dictation Anomaly) The  On-Q trocars were placed and the catheters were threaded. The catheters were wrapped underneath the rectus fascia. The rectus fascia was closed with a running Vicryl suture. The subcutaneous fat was approximated with a plain gut suture. Monocryl 4-0 was used to close the skin incision. Steri-Strips were placed. (Dictation Anomaly) a 4x4 was placed underneath the On-Q catheters. Dermabond was placed at the surgical site. The uterus was massaged. Urine was noted. The patient was taken to recovery after having tolerated the procedure well.   ____________________________ Elliot Gurneyarrie C. Noralyn Karim, MD cck:sb D: 08/09/2013 10:33:00 ET T: 08/09/2013 11:08:56 ET JOB#: [id number]  cc: Elliot Gurneyarrie C. Mysti Haley, MD, <Dictator> Elliot GurneyARRIE C Venna Berberich MD ELECTRONICALLY SIGNED 08/09/2013 22:31
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
